# Patient Record
Sex: Male | Born: 1970 | Race: Black or African American | Hispanic: No | Marital: Single | Smoking: Never smoker
Health system: Southern US, Community
[De-identification: ages and names within clinical notes are randomized; demographics above are authoritative.]

## PROBLEM LIST (undated history)

## (undated) DIAGNOSIS — F4310 Post-traumatic stress disorder, unspecified: Secondary | ICD-10-CM

## (undated) DIAGNOSIS — I10 Essential (primary) hypertension: Secondary | ICD-10-CM

## (undated) HISTORY — PX: SHOULDER SURGERY: SHX246

## (undated) HISTORY — PX: KNEE SURGERY: SHX244

---

## 1999-02-26 ENCOUNTER — Emergency Department (HOSPITAL_COMMUNITY): Admission: EM | Admit: 1999-02-26 | Discharge: 1999-02-27 | Payer: Self-pay | Admitting: Emergency Medicine

## 2000-02-08 ENCOUNTER — Encounter: Payer: Self-pay | Admitting: Emergency Medicine

## 2000-02-08 ENCOUNTER — Inpatient Hospital Stay (HOSPITAL_COMMUNITY): Admission: EM | Admit: 2000-02-08 | Discharge: 2000-02-10 | Payer: Self-pay | Admitting: Emergency Medicine

## 2003-09-21 ENCOUNTER — Emergency Department (HOSPITAL_COMMUNITY): Admission: EM | Admit: 2003-09-21 | Discharge: 2003-09-21 | Payer: Self-pay | Admitting: Emergency Medicine

## 2004-09-13 ENCOUNTER — Ambulatory Visit: Payer: Self-pay | Admitting: Infectious Diseases

## 2004-09-13 ENCOUNTER — Inpatient Hospital Stay (HOSPITAL_COMMUNITY): Admission: EM | Admit: 2004-09-13 | Discharge: 2004-09-17 | Payer: Self-pay | Admitting: Emergency Medicine

## 2005-01-05 ENCOUNTER — Emergency Department (HOSPITAL_COMMUNITY): Admission: EM | Admit: 2005-01-05 | Discharge: 2005-01-05 | Payer: Self-pay | Admitting: Emergency Medicine

## 2005-04-16 ENCOUNTER — Emergency Department (HOSPITAL_COMMUNITY): Admission: EM | Admit: 2005-04-16 | Discharge: 2005-04-16 | Payer: Self-pay | Admitting: Emergency Medicine

## 2005-08-23 ENCOUNTER — Emergency Department (HOSPITAL_COMMUNITY): Admission: EM | Admit: 2005-08-23 | Discharge: 2005-08-23 | Payer: Self-pay | Admitting: Emergency Medicine

## 2007-10-24 ENCOUNTER — Emergency Department (HOSPITAL_COMMUNITY): Admission: EM | Admit: 2007-10-24 | Discharge: 2007-10-24 | Payer: Self-pay | Admitting: Emergency Medicine

## 2008-03-10 ENCOUNTER — Emergency Department (HOSPITAL_COMMUNITY): Admission: EM | Admit: 2008-03-10 | Discharge: 2008-03-10 | Payer: Self-pay | Admitting: Emergency Medicine

## 2010-10-17 ENCOUNTER — Emergency Department (HOSPITAL_COMMUNITY): Payer: Self-pay

## 2010-10-17 ENCOUNTER — Emergency Department (HOSPITAL_COMMUNITY)
Admission: EM | Admit: 2010-10-17 | Discharge: 2010-10-17 | Disposition: A | Discharge status: Home / Self Care | Payer: Self-pay | Attending: Emergency Medicine | Admitting: Emergency Medicine

## 2010-10-17 DIAGNOSIS — I10 Essential (primary) hypertension: Secondary | ICD-10-CM | POA: Insufficient documentation

## 2010-10-17 DIAGNOSIS — IMO0001 Reserved for inherently not codable concepts without codable children: Secondary | ICD-10-CM | POA: Insufficient documentation

## 2010-10-17 DIAGNOSIS — J069 Acute upper respiratory infection, unspecified: Secondary | ICD-10-CM | POA: Insufficient documentation

## 2010-10-17 LAB — DIFFERENTIAL
Basophils Absolute: 0 10*3/uL (ref 0.0–0.1)
Eosinophils Absolute: 0 10*3/uL (ref 0.0–0.7)
Lymphocytes Relative: 31 % (ref 12–46)
Lymphs Abs: 1.3 10*3/uL (ref 0.7–4.0)
Monocytes Relative: 9 % (ref 3–12)
Neutrophils Relative %: 59 % (ref 43–77)

## 2010-10-17 LAB — URINALYSIS, ROUTINE W REFLEX MICROSCOPIC
Protein, ur: 30 mg/dL — AB
Specific Gravity, Urine: 1.034 — ABNORMAL HIGH (ref 1.005–1.030)
Urobilinogen, UA: 1 mg/dL (ref 0.0–1.0)
pH: 6 (ref 5.0–8.0)

## 2010-10-17 LAB — CBC
HCT: 43 % (ref 39.0–52.0)
MCH: 28.1 pg (ref 26.0–34.0)
MCV: 82.9 fL (ref 78.0–100.0)
RBC: 5.19 MIL/uL (ref 4.22–5.81)

## 2010-10-17 LAB — BASIC METABOLIC PANEL
BUN: 9 mg/dL (ref 6–23)
CO2: 31 mEq/L (ref 19–32)
Calcium: 9.4 mg/dL (ref 8.4–10.5)
GFR calc non Af Amer: >60 mL/min (ref >60)

## 2010-10-17 LAB — URINE MICROSCOPIC-ADD ON

## 2010-10-19 LAB — URINE CULTURE: Culture  Setup Time: 201205120013
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
## 2010-10-24 NOTE — H&P (Signed)
[facility]  Patient:    Tony Henderson, Tony Henderson                     MRN: [id number] Adm. Date:  02/08/00 Attending:  Abran Cantor. Clovis Riley, MD                         History and Physical  CHIEF COMPLAINT:  Headache, nausea, vomiting and fever.  HISTORY OF PRESENT ILLNESS:  The patient is a 40 year old black male with a three-day history of progressively worsening headache as well as progressive stiffness of his neck.  He also started having nausea and vomiting with fever two days ago and has not been able to keep down liquids for the past day.  He is feeling somewhat thirsty.  He does have diffuse aching and weakness, but no focal complaints.  No other neurological complaints besides those listed above.  He does not have any known exposure or other possible cause for this. He has never had a problem like this before.  He has had some head congestion for the past week, but not a significant amount.  PAST MEDICAL HISTORY:  Unremarkable.  MEDICATIONS:  He takes some sort of antibiotic weekly for a rash as prescribed by a dermatologist, but he does not know the name of that medicine.  He has taken Benadryl and Tylenol with his current illness, but has not had much success with that.  ALLERGIES:  No known allergies.  SOCIAL HISTORY:  He does not smoke or drink alcohol.  He drinks two caffeine drinks per day.  He works in Clinical biochemist.  FAMILY HISTORY:  Unremarkable.  REVIEW OF SYSTEMS:  Unremarkable for _____ DD:  02/08/00 TD:  02/09/00 Job: [id number]

## 2010-10-24 NOTE — Discharge Summary (Signed)
Tony Henderson, Tony Henderson NO.:  [id number]   MEDICAL RECORD NO.:  [id number]          PATIENT TYPE:  INP   LOCATION:  0350                         FACILITY:  [facility]   PHYSICIAN:  Isidor Holts, M.D.  DATE OF BIRTH:  1971-05-07   DATE OF ADMISSION:  09/13/2004  DATE OF DISCHARGE:  09/17/2004                                 DISCHARGE SUMMARY   PMD:  Unassigned.   DISCHARGE DIAGNOSES:  1.  Aseptic meningitis.  2.  Diet-controlled hypertension.  3.  Early urinary tract infection, resolved.  4.  Renal insufficiency, resolved.   DISCHARGE MEDICATIONS:  Tylenol Extra Strength 1 gm p.o. p.r.n. q.6h. for  headache, over the counter.   PROCEDURE:  1.  Brain CT scan on September 13, 2004:  This was a negative, noncontrast head      CT.  2.  Fluoroscopic-guided lumbar puncture yielding 12 cc of cerebrospinal      fluid, September 13, 2004.   CONSULTATIONS:  Dr. Ninetta Lights, infectious disease.   ADMISSION HISTORY:  As in H&P notes of September 13, 2004; however, in brief,  this is a 40 year old male with a known history of aseptic meningitis in  September, 2001, who presented to the emergency department with a constant  headache described as generalized and not alleviated by Tylenol or NSAIDs.  He has also had generalized aches and pains, photophobia, some nuchal  rigidity, and vomited once. Denies clustering of cases, or sick contacts. He  was admitted for further evaluation, investigation, and management.   CLINICAL COURSE:  1.  Aseptic meningitis:  After initial evaluation, it was felt that the      patient's clinical features were consistent with possible meningitic      illness.  He therefore had a brain CT scan on September 13, 2004, which was      negative.  This was then followed with lumbar puncture under      fluoroscopic guidance, which yielded WBC 340, RBCs 150, segmented      neutrophils 2, percent lymphocytes 91%, monocytes 7%, glucose 44,      protein 102.  Gram's stain was  negative.  Patient was therefore      empirically commenced on a combination of acyclovir and Rocephin to      cover both viral meningitis and early bacterial meningitis.  Further      serologies were sent, including RPR, PCR for enterovirus, HIV test, West      Nile virus, cryptococcal antigen, Coccidioides immitis antigen, and also      fungal culture.  PPD was placed.  The patient was otherwise managed with      NSAIDs, simple analgesics, and intravenous fluids as well as      antiemetics.  Clinical response was satisfactory.  Nausea and      photophobia resolved, and nuchal pain resolved.  The patient remained      alert throughout the course of his hospital stay.  No episodes of      pyrexia were recorded.  On September 16, 2004, the patient was noted to have      a serum  creatinine of 1.8, which represented a significant increase from      admission creatinine of 1.2, i.e., indicative of mild renal      insufficiency.  Acyclovir was therefore discontinued.  Laboratory      studies obtained, including Enterococcus PCR, CSF cryptococcal antigen      was negative as was RPR and HIV test.  PPD was also negative.      Ceftriaxone was subsequently discontinued on September 16, 2004.   1.  Early urinary tract infection:  Urinalysis on September 14, 2004 showed WBCs      of 7-10 but was otherwise negative.  The patient had no symptoms      otherwise, such as dysuria. However, as he was already on ceftriaxone at      the time, no further changes were made to the patient's treatment.  As a      matter of fact, repeat urinalysis on September 17, 2004 was negative.   1.  Mild renal insufficiency:  As mentioned in #1 above, the patient was      found to have a creatinine of 1.8 on September 15, 2004 against his      admitting baseline of 1.2.  It was felt that acyclovir may have been      contributory.  This was discontinued.  Also, NSAIDs were held.  The      patient was rehydrated with intravenous normal saline.   On September 17, 2004, creatinine had normalized to 1.3.   1.  Diet-controlled hypertension:  The patient has a known history of      possible hypertension, although he is not on any medications for this.      He also seems quite reluctant to be started on antihypertensive      medication.  The blood pressure was closely monitored during the      patient's hospital stay.  He was managed with an 2gm sodium diet, and he      remained normotensive throughout.  It is expected that he will establish      an MD, and this will be followed up by his primary care physician.   DISPOSITION:  On September 17, 2004, the patient had improved so much so, that  he was deemed satisfactory to be discharged home.  He was discharged and  recommended off work until September 14, 2004.   PAIN MANAGEMENT:  Not applicable.   DIET:  2gm sodium diet.   WOUND CARE:  Not applicable.   SPECIAL INSTRUCTIONS:  Avoid NSAIDs.   FOLLOW-UP ARRANGEMENTS:  Patient is to follow up with Dr. Ninetta Lights,  infectious disease, in approximately one week.  The telephone number has  been supplied.  All of this has been communicated to the patient, and he has  verbalized understanding.      CO/MEDQ  D:  09/22/2004  T:  09/22/2004  Job:  [id number]   cc:   Lacretia Leigh. Ninetta Lights, M.D.  [address]  [place]  [place] [address]  Fax: [phone]

## 2013-12-12 ENCOUNTER — Emergency Department (HOSPITAL_COMMUNITY)
Admission: EM | Admit: 2013-12-12 | Discharge: 2013-12-12 | Disposition: A | Discharge status: Home / Self Care | Payer: No Typology Code available for payment source | Attending: Emergency Medicine | Admitting: Emergency Medicine

## 2013-12-12 ENCOUNTER — Encounter (HOSPITAL_COMMUNITY): Payer: Self-pay | Admitting: Emergency Medicine

## 2013-12-12 ENCOUNTER — Emergency Department (HOSPITAL_COMMUNITY): Payer: No Typology Code available for payment source

## 2013-12-12 DIAGNOSIS — S161XXA Strain of muscle, fascia and tendon at neck level, initial encounter: Secondary | ICD-10-CM

## 2013-12-12 DIAGNOSIS — Y92410 Unspecified street and highway as the place of occurrence of the external cause: Secondary | ICD-10-CM | POA: Insufficient documentation

## 2013-12-12 DIAGNOSIS — I10 Essential (primary) hypertension: Secondary | ICD-10-CM | POA: Insufficient documentation

## 2013-12-12 DIAGNOSIS — F4310 Post-traumatic stress disorder, unspecified: Secondary | ICD-10-CM | POA: Insufficient documentation

## 2013-12-12 DIAGNOSIS — IMO0002 Reserved for concepts with insufficient information to code with codable children: Secondary | ICD-10-CM | POA: Insufficient documentation

## 2013-12-12 DIAGNOSIS — Y9389 Activity, other specified: Secondary | ICD-10-CM | POA: Insufficient documentation

## 2013-12-12 DIAGNOSIS — Z79899 Other long term (current) drug therapy: Secondary | ICD-10-CM | POA: Insufficient documentation

## 2013-12-12 DIAGNOSIS — S139XXA Sprain of joints and ligaments of unspecified parts of neck, initial encounter: Secondary | ICD-10-CM | POA: Insufficient documentation

## 2013-12-12 HISTORY — DX: Post-traumatic stress disorder, unspecified: F43.10

## 2013-12-12 HISTORY — DX: Essential (primary) hypertension: I10

## 2013-12-12 MED ORDER — HYDROCODONE-ACETAMINOPHEN 5-325 MG PO TABS
2.0000 | ORAL_TABLET | Freq: Once | ORAL | Status: AC
  Administered 2013-12-12: 2 via ORAL
  Filled 2013-12-12: qty 2

## 2013-12-12 MED ORDER — DIAZEPAM 5 MG PO TABS
5.0000 mg | ORAL_TABLET | Freq: Once | ORAL | Status: AC
Start: 2013-12-12 — End: 2013-12-12
  Administered 2013-12-12: 5 mg via ORAL
  Filled 2013-12-12: qty 1

## 2013-12-12 MED ORDER — IBUPROFEN 800 MG PO TABS
800.0000 mg | ORAL_TABLET | Freq: Once | ORAL | Status: AC
  Administered 2013-12-12: 800 mg via ORAL
  Filled 2013-12-12: qty 1

## 2013-12-12 MED ORDER — HYDROCODONE-ACETAMINOPHEN 5-325 MG PO TABS: 1.0000 | ORAL_TABLET | Freq: Four times a day (QID) | ORAL | Status: DC | PRN

## 2013-12-12 MED ORDER — IBUPROFEN 800 MG PO TABS: 800.0000 mg | ORAL_TABLET | Freq: Three times a day (TID) | ORAL | Status: DC

## 2013-12-12 MED ORDER — CYCLOBENZAPRINE HCL 10 MG PO TABS: 10.0000 mg | ORAL_TABLET | Freq: Two times a day (BID) | ORAL | Status: DC | PRN
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
## 2013-12-12 NOTE — Discharge Instructions (Signed)
Take motrin for pain.   Take vicodin for severe pain. DO NOT drive with it.   Take flexeril for muscle spasms.   Follow up with your doctor. You may need further workup if you still have pain in weeks.   Return to ER if you have severe pain, vomiting, abdominal pain.
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
## 2013-12-31 ENCOUNTER — Emergency Department (HOSPITAL_COMMUNITY)
Admission: EM | Admit: 2013-12-31 | Discharge: 2013-12-31 | Disposition: A | Discharge status: Home / Self Care | Payer: No Typology Code available for payment source | Attending: Emergency Medicine | Admitting: Emergency Medicine

## 2013-12-31 ENCOUNTER — Encounter (HOSPITAL_COMMUNITY): Payer: Self-pay | Admitting: Emergency Medicine

## 2013-12-31 DIAGNOSIS — M5380 Other specified dorsopathies, site unspecified: Secondary | ICD-10-CM | POA: Insufficient documentation

## 2013-12-31 DIAGNOSIS — I10 Essential (primary) hypertension: Secondary | ICD-10-CM | POA: Insufficient documentation

## 2013-12-31 DIAGNOSIS — M62830 Muscle spasm of back: Secondary | ICD-10-CM

## 2013-12-31 DIAGNOSIS — Z791 Long term (current) use of non-steroidal anti-inflammatories (NSAID): Secondary | ICD-10-CM | POA: Insufficient documentation

## 2013-12-31 DIAGNOSIS — Z79899 Other long term (current) drug therapy: Secondary | ICD-10-CM | POA: Insufficient documentation

## 2013-12-31 DIAGNOSIS — M62838 Other muscle spasm: Secondary | ICD-10-CM

## 2013-12-31 DIAGNOSIS — F4310 Post-traumatic stress disorder, unspecified: Secondary | ICD-10-CM | POA: Insufficient documentation

## 2013-12-31 DIAGNOSIS — M549 Dorsalgia, unspecified: Secondary | ICD-10-CM | POA: Insufficient documentation

## 2013-12-31 DIAGNOSIS — M542 Cervicalgia: Secondary | ICD-10-CM | POA: Insufficient documentation

## 2013-12-31 MED ORDER — IBUPROFEN 200 MG PO TABS
600.0000 mg | ORAL_TABLET | Freq: Once | ORAL | Status: AC
  Administered 2013-12-31: 600 mg via ORAL
  Filled 2013-12-31: qty 3

## 2013-12-31 MED ORDER — METHOCARBAMOL 500 MG PO TABS: 500.0000 mg | ORAL_TABLET | Freq: Two times a day (BID) | ORAL | Status: DC

## 2013-12-31 MED ORDER — IBUPROFEN 600 MG PO TABS: 600.0000 mg | ORAL_TABLET | Freq: Four times a day (QID) | ORAL | Status: DC | PRN

## 2013-12-31 MED ORDER — TRAMADOL HCL 50 MG PO TABS: 50.0000 mg | ORAL_TABLET | Freq: Four times a day (QID) | ORAL | Status: DC | PRN

## 2013-12-31 NOTE — Discharge Instructions (Signed)
Our exams show that you have spasms causing the pain. Please take the meds prescribed. Ice and heat the area at least 3-4 times a day for 10 min.   Muscle Cramps and Spasms Muscle cramps and spasms occur when a muscle or muscles tighten and you have no control over this tightening (involuntary muscle contraction). They are a common problem and can develop in any muscle. The most common place is in the calf muscles of the leg. Both muscle cramps and muscle spasms are involuntary muscle contractions, but they also have differences:   Muscle cramps are sporadic and painful. They may last a few seconds to a quarter of an hour. Muscle cramps are often more forceful and last longer than muscle spasms.  Muscle spasms may or may not be painful. They may also last just a few seconds or much longer. CAUSES  It is uncommon for cramps or spasms to be due to a serious underlying problem. In many cases, the cause of cramps or spasms is unknown. Some common causes are:   Overexertion.   Overuse from repetitive motions (doing the same thing over and over).   Remaining in a certain position for a long period of time.   Improper preparation, form, or technique while performing a sport or activity.   Dehydration.   Injury.   Side effects of some medicines.   Abnormally low levels of the salts and ions in your blood (electrolytes), especially potassium and calcium. This could happen if you are taking water pills (diuretics) or you are pregnant.  Some underlying medical problems can make it more likely to develop cramps or spasms. These include, but are not limited to:   Diabetes.   Parkinson disease.   Hormone disorders, such as thyroid problems.   Alcohol abuse.   Diseases specific to muscles, joints, and bones.   Blood vessel disease where not enough blood is getting to the muscles.  HOME CARE INSTRUCTIONS   Stay well hydrated. Drink enough water and fluids to keep your urine  clear or pale yellow.  It may be helpful to massage, stretch, and relax the affected muscle.  For tight or tense muscles, use a warm towel, heating pad, or hot shower water directed to the affected area.  If you are sore or have pain after a cramp or spasm, applying ice to the affected area may relieve discomfort.  Put ice in a plastic bag.  Place a towel between your skin and the bag.  Leave the ice on for 15-20 minutes, 03-04 times a day.  Medicines used to treat a known cause of cramps or spasms may help reduce their frequency or severity. Only take over-the-counter or prescription medicines as directed by your caregiver. SEEK MEDICAL CARE IF:  Your cramps or spasms get more severe, more frequent, or do not improve over time.  MAKE SURE YOU:   Understand these instructions.  Will watch your condition.  Will get help right away if you are not doing well or get worse. Document Released: 11/14/2001 Document Revised: 09/19/2012 Document Reviewed: 05/11/2012 [facility] Patient Information 2015 [employer], [place]. This information is not intended to replace advice given to you by your health care provider. Make sure you discuss any questions you have with your health care provider.
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
## 2014-09-04 ENCOUNTER — Encounter (HOSPITAL_COMMUNITY): Payer: Self-pay | Admitting: Emergency Medicine

## 2014-09-04 ENCOUNTER — Emergency Department (HOSPITAL_COMMUNITY)
Admission: EM | Admit: 2014-09-04 | Discharge: 2014-09-04 | Disposition: A | Discharge status: Home / Self Care | Payer: Self-pay | Attending: Emergency Medicine | Admitting: Emergency Medicine

## 2014-09-04 DIAGNOSIS — F4310 Post-traumatic stress disorder, unspecified: Secondary | ICD-10-CM | POA: Insufficient documentation

## 2014-09-04 DIAGNOSIS — Z791 Long term (current) use of non-steroidal anti-inflammatories (NSAID): Secondary | ICD-10-CM | POA: Insufficient documentation

## 2014-09-04 DIAGNOSIS — I10 Essential (primary) hypertension: Secondary | ICD-10-CM | POA: Insufficient documentation

## 2014-09-04 DIAGNOSIS — J0100 Acute maxillary sinusitis, unspecified: Secondary | ICD-10-CM | POA: Insufficient documentation

## 2014-09-04 DIAGNOSIS — Z79899 Other long term (current) drug therapy: Secondary | ICD-10-CM | POA: Insufficient documentation

## 2014-09-04 MED ORDER — SULFAMETHOXAZOLE-TRIMETHOPRIM 800-160 MG PO TABS
1.0000 | ORAL_TABLET | Freq: Once | ORAL | Status: AC
  Administered 2014-09-04: 1 via ORAL
  Filled 2014-09-04: qty 1

## 2014-09-04 MED ORDER — SULFAMETHOXAZOLE-TRIMETHOPRIM 800-160 MG PO TABS: 1.0000 | ORAL_TABLET | Freq: Two times a day (BID) | ORAL | Status: DC

## 2014-09-04 MED ORDER — LORATADINE-PSEUDOEPHEDRINE ER 5-120 MG PO TB12: 1.0000 | ORAL_TABLET | Freq: Two times a day (BID) | ORAL | Status: DC

## 2014-09-04 MED ORDER — LORATADINE 10 MG PO TABS
10.0000 mg | ORAL_TABLET | Freq: Once | ORAL | Status: AC
  Administered 2014-09-04: 10 mg via ORAL
  Filled 2014-09-04: qty 1

## 2014-09-04 NOTE — Discharge Instructions (Signed)
Take bactrim as directed until gone. Take claritin daily for congestion. Refer to attached documents for more information.

## 2014-09-04 NOTE — ED Provider Notes (Signed)
CSN: [id number]     Arrival date & time 09/04/14  1427 History  This chart was scribed for non-physician practitioner, Emilia BeckKaitlyn Vannia Pola, PA-C working with Bethann BerkshireJoseph Zammit, MD by Luisa DagoPriscilla Tutu, Medical Scribe. This patient was seen in room TR09C/TR09C and the patient's care was started at 3:26 PM.    Chief Complaint  Patient presents with  . Sore Throat   Patient is a 44 y.o. male presenting with pharyngitis. The history is provided by the patient and medical records. No language interpreter was used.  Sore Throat The current episode started more than 1 week ago. The problem occurs constantly. The problem has not changed since onset.Pertinent negatives include no chest pain, no abdominal pain and no shortness of breath. Nothing aggravates the symptoms. Nothing relieves the symptoms. Treatments tried: unprescribed amoxicillin and oxycodone.   HPI Comments: Tony Henderson is a 44 y.o. male who presents to the Emergency Department complaining of gradual onset, gradually worsening sore throat that has been persistent for 1 week now. He is also complaining of difficulty swallowing secondary to the worsening pain. Pt states that he is also having difficulty swallowing secondary to the pain. He reports taking unprescribed amoxicillin and oxycodone without relief. Pt denies fever, neck pain, visual disturbance, CP, cough, SOB, abdominal pain, nausea, emesis, diarrhea, urinary symptoms, back pain, HA, weakness, numbness and rash as associated symptoms.     Past Medical History  Diagnosis Date  . Hypertension   . PTSD (post-traumatic stress disorder)     [employer]   History reviewed. No pertinent past surgical history. History reviewed. No pertinent family history. History  Substance Use Topics  . Smoking status: Never Smoker   . Smokeless tobacco: Not on file  . Alcohol Use: Yes     Comment: occ    Review of Systems  Constitutional: Negative for fever and chills.  HENT: Positive for sinus  pressure and sore throat. Negative for congestion, ear pain, rhinorrhea, trouble swallowing and voice change.   Eyes: Negative for discharge.  Respiratory: Negative for cough and shortness of breath.   Cardiovascular: Negative for chest pain.  Gastrointestinal: Negative for abdominal pain.  Genitourinary: Negative.   All other systems reviewed and are negative.     Allergies  Review of patient's allergies indicates no known allergies.  Home Medications   Prior to Admission medications   Medication Sig Start Date End Date Taking? Authorizing Provider  amLODipine-benazepril (LOTREL) 5-20 MG per capsule Take 1 capsule by mouth daily.    Historical Provider, MD  ARIPiprazole (ABILIFY) 20 MG tablet Take 20 mg by mouth daily.    Historical Provider, MD  cyclobenzaprine (FLEXERIL) 10 MG tablet Take 1 tablet (10 mg total) by mouth 2 (two) times daily as needed for muscle spasms. 12/12/13   Richardean Canalavid H Yao, MD  HYDROcodone-acetaminophen (NORCO/VICODIN) 5-325 MG per tablet Take 1 tablet by mouth every 6 (six) hours as needed for moderate pain or severe pain. 12/12/13   Richardean Canalavid H Yao, MD  ibuprofen (ADVIL,MOTRIN) 600 MG tablet Take 1 tablet (600 mg total) by mouth every 6 (six) hours as needed. 12/31/13   Derwood KaplanAnkit Nanavati, MD  ibuprofen (ADVIL,MOTRIN) 800 MG tablet Take 1 tablet (800 mg total) by mouth 3 (three) times daily. 12/12/13   Richardean Canalavid H Yao, MD  methocarbamol (ROBAXIN) 500 MG tablet Take 1 tablet (500 mg total) by mouth 2 (two) times daily. 12/31/13   Derwood KaplanAnkit Nanavati, MD  QUEtiapine (SEROQUEL) 200 MG tablet Take 200 mg by mouth every morning.  Historical Provider, MD  sertraline (ZOLOFT) 100 MG tablet Take 100 mg by mouth 2 (two) times daily.    Historical Provider, MD  traMADol (ULTRAM) 50 MG tablet Take 1 tablet (50 mg total) by mouth every 6 (six) hours as needed. 12/31/13   Ankit Rhunette Croft, MD   BP 146/89 mmHg  Pulse 71  Temp(Src) 97.9 F (36.6 C) (Oral)  Resp 16  Ht 6' (1.829 m)  Wt 220 lb (99.791  kg)  BMI 29.83 kg/m2  SpO2 98%  Physical Exam  Constitutional: He is oriented to person, place, and time. He appears well-developed and well-nourished. No distress.  HENT:  Head: Normocephalic and atraumatic.  Right Ear: External ear normal.  Left Ear: External ear normal.  Nose: Nose normal.  Mouth/Throat: Oropharynx is clear and moist. No oropharyngeal exudate.  Eyes: Conjunctivae and EOM are normal. Pupils are equal, round, and reactive to light.  Neck: Normal range of motion. Neck supple. No thyromegaly present.  Cardiovascular: Normal rate, regular rhythm and normal heart sounds.  Exam reveals no gallop and no friction rub.   No murmur heard. Pulmonary/Chest: Effort normal and breath sounds normal. No respiratory distress. He has no wheezes. He has no rales.  Abdominal: Soft. He exhibits no distension. There is no tenderness.  Musculoskeletal: Normal range of motion. He exhibits no edema or tenderness.  Lymphadenopathy:    He has no cervical adenopathy.  Neurological: He is alert and oriented to person, place, and time.  Skin: Skin is warm and dry.  Psychiatric: He has a normal mood and affect. His behavior is normal.  Nursing note and vitals reviewed.   ED Course  Procedures (including critical care time)  DIAGNOSTIC STUDIES: Oxygen Saturation is 98% on RA, normal by my interpretation.    COORDINATION OF CARE: 3:31 PM-Will prescribe different antibiotics and antihistamines. Pt advised of plan for treatment and pt agrees.   MDM   Final diagnoses:  Acute maxillary sinusitis, recurrence not specified    3:55 PM Patient will be treated for sinusitis with bactrim and claritin for symptoms. Vitals stable and patient afebrile.   I personally performed the services described in this documentation, which was scribed in my presence. The recorded information has been reviewed and is accurate.    [address] [place], PA-C 09/05/14 0845  Bethann Berkshire, MD 09/05/14 1041

## 2014-09-04 NOTE — ED Notes (Signed)
Pt c/o sore throat x several days; pt sts pain with swallowing

## 2014-11-14 ENCOUNTER — Encounter (HOSPITAL_COMMUNITY): Payer: Self-pay | Admitting: Emergency Medicine

## 2014-11-14 ENCOUNTER — Emergency Department (HOSPITAL_COMMUNITY)
Admission: EM | Admit: 2014-11-14 | Discharge: 2014-11-15 | Disposition: A | Discharge status: Home / Self Care | Payer: Self-pay | Attending: Emergency Medicine | Admitting: Emergency Medicine

## 2014-11-14 DIAGNOSIS — Z79899 Other long term (current) drug therapy: Secondary | ICD-10-CM | POA: Insufficient documentation

## 2014-11-14 DIAGNOSIS — F4310 Post-traumatic stress disorder, unspecified: Secondary | ICD-10-CM | POA: Insufficient documentation

## 2014-11-14 DIAGNOSIS — R519 Headache, unspecified: Secondary | ICD-10-CM

## 2014-11-14 DIAGNOSIS — R51 Headache: Secondary | ICD-10-CM | POA: Insufficient documentation

## 2014-11-14 DIAGNOSIS — R2241 Localized swelling, mass and lump, right lower limb: Secondary | ICD-10-CM | POA: Insufficient documentation

## 2014-11-14 DIAGNOSIS — I10 Essential (primary) hypertension: Secondary | ICD-10-CM | POA: Insufficient documentation

## 2014-11-14 DIAGNOSIS — R112 Nausea with vomiting, unspecified: Secondary | ICD-10-CM | POA: Insufficient documentation

## 2014-11-14 DIAGNOSIS — M7989 Other specified soft tissue disorders: Secondary | ICD-10-CM

## 2014-11-14 LAB — COMPREHENSIVE METABOLIC PANEL
ALT: 19 U/L (ref 17–63)
ANION GAP: 11 (ref 5–15)
AST: 20 U/L (ref 15–41)
Albumin: 4.3 g/dL (ref 3.5–5.0)
Alkaline Phosphatase: 68 U/L (ref 38–126)
BILIRUBIN TOTAL: 1.1 mg/dL (ref 0.3–1.2)
BUN: 15 mg/dL (ref 6–20)
CALCIUM: 9.6 mg/dL (ref 8.9–10.3)
CO2: 27 mmol/L (ref 22–32)
CREATININE: 1.27 mg/dL — AB (ref 0.61–1.24)
Chloride: 99 mmol/L — ABNORMAL LOW (ref 101–111)
GFR calc Af Amer: >60 mL/min (ref >60)
GLUCOSE: 119 mg/dL — AB (ref 65–99)
Potassium: 3.7 mmol/L (ref 3.5–5.1)
Sodium: 137 mmol/L (ref 135–145)
Total Protein: 7.6 g/dL (ref 6.5–8.1)

## 2014-11-14 LAB — CBC WITH DIFFERENTIAL/PLATELET
BASOS ABS: 0 10*3/uL (ref 0.0–0.1)
BASOS PCT: 0 % (ref 0–1)
Eosinophils Absolute: 0 10*3/uL (ref 0.0–0.7)
Eosinophils Relative: 1 % (ref 0–5)
HEMATOCRIT: 42.2 % (ref 39.0–52.0)
Hemoglobin: 14.3 g/dL (ref 13.0–17.0)
LYMPHS ABS: 1.3 10*3/uL (ref 0.7–4.0)
Lymphocytes Relative: 21 % (ref 12–46)
MCH: 27.9 pg (ref 26.0–34.0)
MCHC: 33.9 g/dL (ref 30.0–36.0)
MCV: 82.4 fL (ref 78.0–100.0)
MONO ABS: 0.3 10*3/uL (ref 0.1–1.0)
Monocytes Relative: 4 % (ref 3–12)
Neutro Abs: 4.4 10*3/uL (ref 1.7–7.7)
Neutrophils Relative %: 73 % (ref 43–77)
Platelets: 118 10*3/uL — ABNORMAL LOW (ref 150–400)
RBC: 5.12 MIL/uL (ref 4.22–5.81)
RDW: 13.3 % (ref 11.5–15.5)
WBC: 6 10*3/uL (ref 4.0–10.5)

## 2014-11-14 MED ORDER — METOCLOPRAMIDE HCL 5 MG/ML IJ SOLN
10.0000 mg | Freq: Once | INTRAMUSCULAR | Status: AC
  Administered 2014-11-14: 10 mg via INTRAVENOUS
  Filled 2014-11-14: qty 2

## 2014-11-14 MED ORDER — ONDANSETRON 4 MG PO TBDP
8.0000 mg | ORAL_TABLET | Freq: Once | ORAL | Status: AC
  Administered 2014-11-14: 8 mg via ORAL

## 2014-11-14 MED ORDER — ONDANSETRON 4 MG PO TBDP
ORAL_TABLET | ORAL | Status: AC
  Filled 2014-11-14: qty 2

## 2014-11-14 MED ORDER — DIPHENHYDRAMINE HCL 50 MG/ML IJ SOLN
25.0000 mg | Freq: Once | INTRAMUSCULAR | Status: AC
  Administered 2014-11-14: 25 mg via INTRAVENOUS
  Filled 2014-11-14: qty 1

## 2014-11-14 MED ORDER — KETOROLAC TROMETHAMINE 30 MG/ML IJ SOLN
30.0000 mg | Freq: Once | INTRAMUSCULAR | Status: AC
  Administered 2014-11-14: 30 mg via INTRAVENOUS
  Filled 2014-11-14: qty 1

## 2014-11-14 MED ORDER — SODIUM CHLORIDE 0.9 % IV BOLUS (SEPSIS)
1000.0000 mL | Freq: Once | INTRAVENOUS | Status: AC
  Administered 2014-11-14: 1000 mL via INTRAVENOUS

## 2014-11-14 NOTE — ED Notes (Addendum)
C/o headache since this morning and vomiting since this afternoon.  Pt [facility].  States he had been constipated s/p surgery but was able to have BM yesterday.  Taking Vicodin for pain.

## 2014-11-14 NOTE — ED Provider Notes (Signed)
CSN: [id number]     Arrival date & time 11/14/14  2009 History  This chart was scribed for Devoria Albe, MD by Annye Asa, ED Scribe. This patient was seen in room A10C/A10C and the patient's care was started at 11:09 PM.    Chief Complaint  Patient presents with  . Emesis  . Headache   The history is provided by the patient. No language interpreter was used.     HPI Comments: Tony Henderson is a 44 y.o. male who presents to the Emergency Department complaining of "thorbbing" frontal headache, with nausea and vomiting, beginning this afternoon around 14:30. Patient states his pain is exacerbated with movement, certain positions, and smells; it abates slightly when stationary. He explains he had a knee surgery done (right knee, torn meniscus and cartilage repair at the [place]) on 11/09/14 and was told to gradually begin bearing weight on the knee; he has attempted to do move about the house recently, after staying in bed for several days. He denies any other recent injuries, trauma, or strenuous activity. He notes some constipation since the surgery but was able to have a BM yesterday. He denies fever, photophobia, phonophobia, chest pain, SOB, dizziness, spinning, blurred or double vision, neck pain, numbness, in extremities, weakness, or paresthesias. He denies prior experience with similar headaches.   PCP at [facility].   Past Medical History  Diagnosis Date  . Hypertension   . PTSD (post-traumatic stress disorder)     [employer]   History reviewed. No pertinent past surgical history. No family history on file. History  Substance Use Topics  . Smoking status: Never Smoker   . Smokeless tobacco: Not on file  . Alcohol Use: Yes     Comment: occ  on disabilitly  Review of Systems  Constitutional: Negative for fever.  Eyes: Negative for photophobia and visual disturbance.  Respiratory: Negative for shortness of breath.   Cardiovascular: Negative for chest pain.  Gastrointestinal: Positive  for nausea and vomiting.  Musculoskeletal: Negative for neck pain.  Neurological: Positive for headaches. Negative for dizziness and numbness.  All other systems reviewed and are negative.   Allergies  Review of patient's allergies indicates no known allergies.  Home Medications   Prior to Admission medications   Medication Sig Start Date End Date Taking? Authorizing Provider  amLODipine-benazepril (LOTREL) 5-20 MG per capsule Take 1 capsule by mouth daily.   Yes Historical Provider, MD  ARIPiprazole (ABILIFY) 20 MG tablet Take 20 mg by mouth daily.   Yes Historical Provider, MD  QUEtiapine (SEROQUEL) 200 MG tablet Take 200 mg by mouth every morning.   Yes Historical Provider, MD  sertraline (ZOLOFT) 100 MG tablet Take 100 mg by mouth 2 (two) times daily.   Yes Historical Provider, MD   BP 173/118 mmHg  Pulse 91  Temp(Src) 98.5 F (36.9 C) (Oral)  Resp 20  SpO2 100%  Vital signs normal except hypertension  Physical Exam  Constitutional: He is oriented to person, place, and time. He appears well-developed and well-nourished.  Non-toxic appearance. He does not appear ill. No distress.  HENT:  Head: Normocephalic and atraumatic.  Right Ear: External ear normal.  Left Ear: External ear normal.  Nose: Nose normal. No mucosal edema or rhinorrhea.  Mouth/Throat: Oropharynx is clear and moist and mucous membranes are normal. No dental abscesses or uvula swelling.  Eyes: Conjunctivae and EOM are normal. Pupils are equal, round, and reactive to light.  Neck: Normal range of motion and full passive range of  motion without pain. Neck supple.  Cardiovascular: Normal rate, regular rhythm and normal heart sounds.  Exam reveals no gallop and no friction rub.   No murmur heard. Pulmonary/Chest: Effort normal and breath sounds normal. No respiratory distress. He has no wheezes. He has no rhonchi. He has no rales. He exhibits no tenderness and no crepitus.  Abdominal: Soft. Normal appearance and  bowel sounds are normal. He exhibits no distension. There is no tenderness. There is no rebound and no guarding.  Musculoskeletal: Normal range of motion. He exhibits tenderness. He exhibits no edema.  Two incisions with sutures in the right knee; some mild diffuse swelling in lower right leg and tenderness in his calf.   Neurological: He is alert and oriented to person, place, and time. He has normal strength. No cranial nerve deficit.  Skin: Skin is warm, dry and intact. No rash noted. No erythema. No pallor.  Psychiatric: He has a normal mood and affect. His speech is normal and behavior is normal. His mood appears not anxious.  Nursing note and vitals reviewed.   ED Course  Procedures   Medications  ondansetron (ZOFRAN-ODT) disintegrating tablet 8 mg (8 mg Oral Given 11/14/14 2026)  sodium chloride 0.9 % bolus 1,000 mL (0 mLs Intravenous Stopped 11/15/14 0047)  metoCLOPramide (REGLAN) injection 10 mg (10 mg Intravenous Given 11/14/14 2343)  diphenhydrAMINE (BENADRYL) injection 25 mg (25 mg Intravenous Given 11/14/14 2340)  ketorolac (TORADOL) 30 MG/ML injection 30 mg (30 mg Intravenous Given 11/14/14 2342)  magnesium sulfate IVPB 2 g 50 mL (0 g Intravenous Stopped 11/15/14 0416)  dexamethasone (DECADRON) injection 10 mg (10 mg Intravenous Given 11/15/14 0401)     DIAGNOSTIC STUDIES: Oxygen Saturation is 100% on RA, normal by my interpretation.    COORDINATION OF CARE: 11:15 PM Discussed treatment plan with pt at bedside and pt agreed to plan.  3:47 AM Returned to update and reevaluate patient, who reports that his headache has improved but not resolved entirely. He is requesting further medication for his headache at this time.   Pt keeps requesting food from nursing staff.  At 5 am pt states his HA is  "1" his main concern is getting food.     No results found.   Labs Review  Results for orders placed or performed during the hospital encounter of 11/14/14  CBC with Differential   Result Value Ref Range   WBC 6.0 4.0 - 10.5 K/uL   RBC 5.12 4.22 - 5.81 MIL/uL   Hemoglobin 14.3 13.0 - 17.0 g/dL   HCT 16.142.2 09.639.0 - 04.552.0 %   MCV 82.4 78.0 - 100.0 fL   MCH 27.9 26.0 - 34.0 pg   MCHC 33.9 30.0 - 36.0 g/dL   RDW 40.913.3 81.111.5 - 91.415.5 %   Platelets 118 (L) 150 - 400 K/uL   Neutrophils Relative % 73 43 - 77 %   Neutro Abs 4.4 1.7 - 7.7 K/uL   Lymphocytes Relative 21 12 - 46 %   Lymphs Abs 1.3 0.7 - 4.0 K/uL   Monocytes Relative 4 3 - 12 %   Monocytes Absolute 0.3 0.1 - 1.0 K/uL   Eosinophils Relative 1 0 - 5 %   Eosinophils Absolute 0.0 0.0 - 0.7 K/uL   Basophils Relative 0 0 - 1 %   Basophils Absolute 0.0 0.0 - 0.1 K/uL  Comprehensive metabolic panel  Result Value Ref Range   Sodium 137 135 - 145 mmol/L   Potassium 3.7 3.5 - 5.1 mmol/L  Chloride 99 (L) 101 - 111 mmol/L   CO2 27 22 - 32 mmol/L   Glucose, Bld 119 (H) 65 - 99 mg/dL   BUN 15 6 - 20 mg/dL   Creatinine, Ser 1.61 (H) 0.61 - 1.24 mg/dL   Calcium 9.6 8.9 - 09.6 mg/dL   Total Protein 7.6 6.5 - 8.1 g/dL   Albumin 4.3 3.5 - 5.0 g/dL   AST 20 15 - 41 U/L   ALT 19 17 - 63 U/L   Alkaline Phosphatase 68 38 - 126 U/L   Total Bilirubin 1.1 0.3 - 1.2 mg/dL   GFR calc non Af Amer >60 >60 mL/min   GFR calc Af Amer >60 >60 mL/min   Anion gap 11 5 - 15  Urinalysis, Routine w reflex microscopic (not at [facility])  Result Value Ref Range   Color, Urine YELLOW YELLOW   APPearance CLEAR CLEAR   Specific Gravity, Urine 1.026 1.005 - 1.030   pH 7.0 5.0 - 8.0   Glucose, UA NEGATIVE NEGATIVE mg/dL   Hgb urine dipstick SMALL (A) NEGATIVE   Bilirubin Urine NEGATIVE NEGATIVE   Ketones, ur 40 (A) NEGATIVE mg/dL   Protein, ur NEGATIVE NEGATIVE mg/dL   Urobilinogen, UA 1.0 0.0 - 1.0 mg/dL   Nitrite NEGATIVE NEGATIVE   Leukocytes, UA NEGATIVE NEGATIVE  Urine microscopic-add on  Result Value Ref Range   Squamous Epithelial / LPF RARE RARE   WBC, UA 0-2 <3 WBC/hpf   RBC / HPF 3-6 <3 RBC/hpf   Bacteria, UA RARE RARE    Laboratory interpretation all normal except renal insufficiency     Imaging Review No results found.   EKG Interpretation None      MDM   Final diagnoses:  Headache, unspecified headache type  Swelling of right lower extremity    Pt to get outpatient doppler US of his RLE today.  Plan discharge  Devoria Albe, MD, FACEP   I personally performed the services described in this documentation, which was scribed in my presence. The recorded information has been reviewed and considered.  Devoria Albe, MD, Concha Pyo, MD 11/15/14 [phone]

## 2014-11-15 LAB — URINE MICROSCOPIC-ADD ON

## 2014-11-15 LAB — URINALYSIS, ROUTINE W REFLEX MICROSCOPIC
BILIRUBIN URINE: NEGATIVE
Glucose, UA: NEGATIVE mg/dL
Ketones, ur: 40 mg/dL — AB
Leukocytes, UA: NEGATIVE
Nitrite: NEGATIVE
PH: 7 (ref 5.0–8.0)
Protein, ur: NEGATIVE mg/dL
SPECIFIC GRAVITY, URINE: 1.026 (ref 1.005–1.030)
UROBILINOGEN UA: 1 mg/dL (ref 0.0–1.0)

## 2014-11-15 MED ORDER — MAGNESIUM SULFATE 2 GM/50ML IV SOLN
2.0000 g | Freq: Once | INTRAVENOUS | Status: AC
  Administered 2014-11-15: 2 g via INTRAVENOUS
  Filled 2014-11-15: qty 50

## 2014-11-15 MED ORDER — DEXAMETHASONE SODIUM PHOSPHATE 4 MG/ML IJ SOLN
10.0000 mg | Freq: Once | INTRAMUSCULAR | Status: AC
  Administered 2014-11-15: 10 mg via INTRAVENOUS
  Filled 2014-11-15: qty 3

## 2014-11-15 NOTE — Discharge Instructions (Signed)
Go home and rest. You should get a call today about getting the test to check your right leg for a blood clot.  Return to the ED if you get chest pain or shortness of breath.

## 2014-11-18 ENCOUNTER — Emergency Department (HOSPITAL_COMMUNITY)
Admission: EM | Admit: 2014-11-18 | Discharge: 2014-11-18 | Disposition: A | Discharge status: Home / Self Care | Payer: Non-veteran care | Attending: Emergency Medicine | Admitting: Emergency Medicine

## 2014-11-18 ENCOUNTER — Emergency Department (HOSPITAL_BASED_OUTPATIENT_CLINIC_OR_DEPARTMENT_OTHER)
Admit: 2014-11-18 | Discharge: 2014-11-18 | Disposition: A | Discharge status: Home / Self Care | Payer: Non-veteran care | Attending: Emergency Medicine | Admitting: Emergency Medicine

## 2014-11-18 ENCOUNTER — Encounter (HOSPITAL_COMMUNITY): Payer: Self-pay | Admitting: Emergency Medicine

## 2014-11-18 DIAGNOSIS — I10 Essential (primary) hypertension: Secondary | ICD-10-CM | POA: Diagnosis not present

## 2014-11-18 DIAGNOSIS — R51 Headache: Secondary | ICD-10-CM | POA: Diagnosis present

## 2014-11-18 DIAGNOSIS — R630 Anorexia: Secondary | ICD-10-CM | POA: Insufficient documentation

## 2014-11-18 DIAGNOSIS — F4310 Post-traumatic stress disorder, unspecified: Secondary | ICD-10-CM | POA: Diagnosis not present

## 2014-11-18 DIAGNOSIS — M79661 Pain in right lower leg: Secondary | ICD-10-CM | POA: Insufficient documentation

## 2014-11-18 DIAGNOSIS — M7989 Other specified soft tissue disorders: Secondary | ICD-10-CM

## 2014-11-18 DIAGNOSIS — R519 Headache, unspecified: Secondary | ICD-10-CM

## 2014-11-18 DIAGNOSIS — Z9119 Patient's noncompliance with other medical treatment and regimen: Secondary | ICD-10-CM | POA: Insufficient documentation

## 2014-11-18 DIAGNOSIS — K029 Dental caries, unspecified: Secondary | ICD-10-CM

## 2014-11-18 DIAGNOSIS — M79609 Pain in unspecified limb: Secondary | ICD-10-CM

## 2014-11-18 DIAGNOSIS — R110 Nausea: Secondary | ICD-10-CM | POA: Insufficient documentation

## 2014-11-18 DIAGNOSIS — K088 Other specified disorders of teeth and supporting structures: Secondary | ICD-10-CM | POA: Insufficient documentation

## 2014-11-18 DIAGNOSIS — Z79899 Other long term (current) drug therapy: Secondary | ICD-10-CM | POA: Diagnosis not present

## 2014-11-18 DIAGNOSIS — Z9114 Patient's other noncompliance with medication regimen: Secondary | ICD-10-CM

## 2014-11-18 DIAGNOSIS — Z8679 Personal history of other diseases of the circulatory system: Secondary | ICD-10-CM

## 2014-11-18 MED ORDER — PROMETHAZINE HCL 25 MG PO TABS: 25.0000 mg | ORAL_TABLET | Freq: Four times a day (QID) | ORAL | Status: DC | PRN

## 2014-11-18 MED ORDER — DIPHENHYDRAMINE HCL 50 MG/ML IJ SOLN
25.0000 mg | Freq: Once | INTRAMUSCULAR | Status: AC
  Administered 2014-11-18: 25 mg via INTRAVENOUS
  Filled 2014-11-18: qty 1

## 2014-11-18 MED ORDER — METOCLOPRAMIDE HCL 5 MG/ML IJ SOLN
10.0000 mg | Freq: Once | INTRAMUSCULAR | Status: AC
  Administered 2014-11-18: 10 mg via INTRAVENOUS
  Filled 2014-11-18: qty 2

## 2014-11-18 MED ORDER — AMLODIPINE BESYLATE 10 MG PO TABS
10.0000 mg | ORAL_TABLET | Freq: Once | ORAL | Status: AC
  Administered 2014-11-18: 10 mg via ORAL
  Filled 2014-11-18: qty 1

## 2014-11-18 MED ORDER — ATENOLOL 50 MG PO TABS
50.0000 mg | ORAL_TABLET | Freq: Once | ORAL | Status: AC
  Administered 2014-11-18: 50 mg via ORAL
  Filled 2014-11-18: qty 1

## 2014-11-18 MED ORDER — KETOROLAC TROMETHAMINE 30 MG/ML IJ SOLN
30.0000 mg | Freq: Once | INTRAMUSCULAR | Status: AC
  Administered 2014-11-18: 30 mg via INTRAVENOUS
  Filled 2014-11-18: qty 1

## 2014-11-18 MED ORDER — DEXAMETHASONE SODIUM PHOSPHATE 10 MG/ML IJ SOLN
10.0000 mg | Freq: Once | INTRAMUSCULAR | Status: AC
  Administered 2014-11-18: 10 mg via INTRAVENOUS
  Filled 2014-11-18: qty 1

## 2014-11-18 MED ORDER — SODIUM CHLORIDE 0.9 % IV BOLUS (SEPSIS)
1000.0000 mL | Freq: Once | INTRAVENOUS | Status: AC
  Administered 2014-11-18: 1000 mL via INTRAVENOUS

## 2014-11-18 NOTE — ED Provider Notes (Signed)
Complains of diffuse headache gradual onset 8 days ago. Typical of headaches he gets 3 times per week for the past 20 years associated symptoms include nausea and photophobia. No fever no neck pain noted neck stiffness. He's been noncompliant with his blood pressure medication for the past  days On exam alert Glasgow Coma Score 15. No distress HEENT exam the facial asymmetry eyes pupils equal round react to light extraocular muscles intact. Fundi not well visualized No signs of meningitis neurologic Glasgow Coma Score 15 gait normal Romberg normal pronator drift normal finger to nose normal. Patient states his headache is currently under control. I stressed with him importance of compliance with medications. No signs of encephalopathy or meningitis  Doug Sou, MD 11/18/14 [phone]

## 2014-11-18 NOTE — ED Notes (Signed)
Pt c/o RLE edema, pt has had recent knee surgery at [facility], was due to have u/s on Monday but could not come back to have test. Pt also c/o HA, pt concerned d/t hx of menigitis.

## 2014-11-18 NOTE — Discharge Instructions (Signed)
°Emergency Department Resource Guide °1) Find a Doctor and Pay Out of Pocket °Although you won't have to find out who is covered by your insurance plan, it is a good idea to ask around and get recommendations. You will then need to call the office and see if the doctor you have chosen will accept you as a new patient and what types of options they offer for patients who are self-pay. Some doctors offer discounts or will set up payment plans for their patients who do not have insurance, but you will need to ask so you aren't surprised when you get to your appointment. ° °2) Contact Your Local Health Department °Not all health departments have doctors that can see patients for sick visits, but many do, so it is worth a call to see if yours does. If you don't know where your local health department is, you can check in your phone book. The CDC also has a tool to help you locate your state's health department, and many state websites also have listings of all of their local health departments. ° °3) Find a Walk-in Clinic °If your illness is not likely to be very severe or complicated, you may want to try a walk in clinic. These are popping up all over the country in pharmacies, drugstores, and shopping centers. They're usually staffed by nurse practitioners or physician assistants that have been trained to treat common illnesses and complaints. They're usually fairly quick and inexpensive. However, if you have serious medical issues or chronic medical problems, these are probably not your best option. ° °No Primary Care Doctor: °- Call [facility] at  832-8000 - they can help you locate a primary care doctor that  accepts your insurance, provides certain services, etc. °- Physician Referral Service- 1-800-533-3463 ° °Chronic Pain Problems: °Organization         Address  Phone   Notes  °[facility]  (336) 297-2271 Patients need to be referred by their primary care doctor.  ° °Medication  Assistance: °Organization         Address  Phone   Notes  °[place] [facility] [address] °[place], [place] [address] (336) 641-8030 --Must be a resident of [place] °-- Must have NO insurance coverage whatsoever (no Medicaid/ Medicare, etc.) °-- The pt. MUST have a primary care doctor that directs their care regularly and follows them in the community °  °[facility]  (866) 331-1348   °[employer]  (888) 892-1162   ° °Agencies that provide inexpensive medical care: °Organization         Address  Phone   Notes  °[facility]  (336) 832-8035   °[facility]    (336) 832-7272   °[facility] [address] °[place],  [address] (336) 832-4777   °[facility] [address], °[place] (336) 271-4999   °[facility]    (336) 373-0678   °[facility]    (336) 272-1050   °[facility] ° [address], [place] Phone:  (336) 832-4444, Fax:  (336) 832-4440 Hours of Operation:  9 am - 6 pm, M-F.  Also accepts Medicaid/Medicare and self-pay.  °[facility] ° [address], [place] Phone: (336) 832-3150, Fax: (336) 832-3151. Hours of Operation:  8:30 am - 5:30 pm, M-F.  Also accepts Medicaid and self-pay.  °[facility] [place] [address], [place] Phone: (336) 878-6027   °[facility] [address], [place], [place] (336)723-1848, Ext. 123 Mondays & Thursdays: 7-9 AM.  First 15 patients are seen on a first come, first serve basis. °  ° °Medicaid-accepting [place] Providers: ° °Organization         Address  Phone   Notes  °[facility] [address], [place] (336) 641-2100 Also accepts self-pay patients.  °[facility] [address], [place] ° (336) 856-9996   °[facility] [address], [place]  (336) 288-8857   °[facility] [address] [place] [address], [place] (336) 299-7000   °Veita Bland [address], [place]  ° (336) 373-1557 Only accepts [place] Access Medicaid patients after they have their name applied to their card.  ° °Self-Pay (no insurance) in [place]: ° °Organization         Address  Phone   Notes  °Sickle Cell Patients, [facility] [address], [place] (336) 832-1970   °[facility] [address], [place] (336) 832-4400   °[facility] ° [address], [place] (336) 992-4800   °[facility]/Dr. Osei-Bonsu ° [address] [place] [address], [place] or [address], [place] (336) 841-8500 Phone number for both [place] and [place] locations is the same.  °[facility] [address], [place] (336) 299-0000   °[facility] [address] [place] [address], [place] or [address] (336) 852-7530 °(336) 878-2260   °[facility] [address],  (336) 350-1642, phone; (336) 294-5005, fax Sees patients 1st and 3rd Saturday of every month.  Must not qualify for public or private insurance (i.e. Medicaid, Medicare, [place] Health Choice, Veterans' Benefits) • Household income should be no more than 200% of the poverty level •The clinic cannot treat you if you are pregnant or think you are pregnant • Sexually transmitted diseases are not treated at the clinic.  ° ° °Dental Care: °Organization         Address  Phone  Notes  °[place] [facility] [address],  (336) 641-6152 Accepts children up to age 21 who are enrolled in Medicaid or [place] Health Choice; pregnant women with a Medicaid card; and children who have applied for Medicaid or [place] Health Choice, but were declined, whose parents can pay a reduced fee at time of service.  °[place]  [facility] [place]  [address], [place] (336) 641-7733 Accepts children up to age 21 who are enrolled in Medicaid or [place] Health Choice; pregnant women with a Medicaid card; and children who have applied for Medicaid or [place] Health Choice, but were declined, whose parents can pay a reduced fee at time of service.  °[facility] ° [address],  (336) 641-4533 Patients are seen by appointment only. Walk-ins are not accepted. [facility] will see patients 18 years of age and older. °Monday - Tuesday (8am-5pm) °Most Wednesdays (8:30-5pm) °$30 per visit, cash only  °[facility] ° [address], [place] (336) 641-4533 Patients are seen by appointment only. Walk-ins are not accepted. [facility] will see patients 18 years of age and older. °One   Wednesday Evening (Monthly: Volunteer Based).  $30 per visit, cash only  °[facility]  (919) 537-3737 for adults; Children under age 4, call Graduate Pediatric Dentistry at ([phone]) [phone]. Children aged 4-14, please call (919) [phone] to request a pediatric application. ° Dental services are provided in all areas of dental care including fillings, crowns and bridges, complete and partial dentures, implants, gum treatment, root canals, and extractions. Preventive care is also provided. Treatment is provided to both adults and children. °Patients are selected via a lottery and there is often a waiting list. °  °[facility] [address], °[place] ° (336) 763-8833 [email or web address] °  °[facility] [address], [place], [place] (336)723-1848, Ext. 123 Second and Fourth Thursday of each month, opens at 6:30 AM; Clinic ends at 9 AM.  Patients are seen on a first-come first-served basis, and a limited number are seen during each clinic.  ° °[facility] ° [address], [place], [place] (336) 723-7904    Eligibility Requirements °You must have lived in [place], [place], or [place] counties for at least the last three months. °  You cannot be eligible for state or federal sponsored healthcare insurance, including Veterans Administration, Medicaid, or Medicare. °  You generally cannot be eligible for healthcare insurance through your employer.  °  How to apply: °Eligibility screenings are held every Tuesday and Wednesday afternoon from 1:00 pm until 4:00 pm. You do not need an appointment for the interview!  °[facility] [address], [place], [place] 336-631-2330   °[facility] [facility]  336-342-8273   °[facility]  336-703-3100   °[facility]  336-570-6415   ° °Behavioral Health Resources in the Community: °Intensive Outpatient Programs °Organization         Address  Phone  Notes  °[facility] [address], [place], [place] 336-878-6098   °[facility] [address], [place], [place] 336-832-9800   °[facility] [address], [place], [place] ° 336-882-2125   °[facility] [address],  °[place], [place] 1-800-853-5163 or 336-641-4981   °Substance Abuse Resources °Organization         Address  Phone  Notes  °[facility]  336-882-2125   °[facility]  336-784-9470   °[facility]  336-285-9073   °[facility]  336-845-3988   °Residential & Outpatient Substance Abuse Program  1-800-659-3381   °Psychological Services °Organization         Address  Phone  Notes  °[facility]  336- 832-9600   °[facility]  336- 378-7881   °[facility] [address], [place] 1-800-853-5163 or 336-641-4981   ° °Mobile Crisis Teams °Organization         Address  Phone  Notes  °[facility], Mobile Crisis Care Unit  1-877-626-1772   °Assertive °[facility] ° [address]  [place], [place] 336-834-9664   °Sharon DeEsch [address] ° [place] 336-554-5454   ° °Self-Help/Support Groups °Organization         Address  Phone             Notes  °[facility]  - variety of support groups  336- 373-1402 Call for more information  °Narcotics Anonymous (NA), [facility] [address], °[place]  2 meetings at this location  ° °  Residential Treatment Programs °Organization         Address  Phone  Notes  °[facility] [address],    °[place]  1-866-801-8205   °[facility] ° [address], [place], [place] 704-293-8524   °[facility] [address], [place] 336-845-3988 Admissions: 8am-3pm M-F  °[facility] [address]    °[place], [place] 336-841-1104   °[facility] [address], [place], [place] 336-379-7146   °[facility] [address]  °[place], [place] 336-285-9073   °[facility] Intensive Outpatient [address], [place], [place] 336-852-3033   °[facility] [address]  °[place], [place] 1-877-615-2722 or 336-784-9470   °[facility] [address] , [place] 336-227-7417 Accepts Medicaid  °[facility] [address]  ° [place] 1-800-659-3381 Substance Abuse/Addiction Treatment  ° °[place] Behavioral Health Resources °Organization         Address  Phone  Notes  °[facility]  (888) 581-9988   °Julie Brannon, PhD [address] [place], [place]   (336) 349-5553 or (336) 951-0000   °[facility]   [address] °[place], [place] (336) 349-4454   °[facility] [address], [place], [place] (336) 342-8316 Insurance/Medicaid/sponsorship through [facility]  °[facility] [address]                                    [place], [place] (336) 342-8316 Therapy/tele-psych/case    °[facility] [address]  ° [place], [place] (336) 349-2233    °Dr. Arfeen  (336) 349-4544   °[facility] [place]  [employer] [place] [facility] 1) [address], [place] °2) [address], [place] °3)  [address], [place] (336) 349-3220 °(336) 342-7768 ° °(336) 342-8140   °[place] [facility] (336) 342-1394 or (336) 342-3537 (After Hours)    ° ° °

## 2014-11-18 NOTE — ED Notes (Signed)
Spoke with Karren Burly Tech. Unable to provide and ETA.

## 2014-11-18 NOTE — Progress Notes (Signed)
Right lower extremity venous duplex completed.  Right:  No evidence of DVT, superficial thrombosis, or Baker's cyst.  Left:  Negative for DVT in the common femoral vein.  

## 2014-11-18 NOTE — ED Provider Notes (Signed)
CSN: [id number]     Arrival date & time 11/18/14  0553 History   First MD Initiated Contact with Patient 11/18/14 [phone]     Chief Complaint  Patient presents with  . Leg Swelling  . Headache     (Consider location/radiation/quality/duration/timing/severity/associated sxs/prior Treatment) HPI Pt is a 44yo male with hx of HTN and PTSD, presenting to ED with c/o gradually worsening diffuse headache, 10/10, worse with light and sound. States he thinks it is meningitis as he has had this in the past but denies fever, chills, neck pain or stiffness.  Pt states he has a bad tooth and thinks that could be what is causing his headaches as he was seen on 11/14/14 for same.  Pt states HA feels similar to previous.  Pt does report hx of HAs 3 times a week for 20 years.  He has not f/u with neurology.  Pt also c/o Right leg pain and swelling, reports having Right knee surgery at the [facility] on 11/09/14.  Pt was suppose to return to [facility] for an ultrasound but states he did not get around to going and would like one today.  Pt reports taking hydrocodone and cephalexin for his tooth w/o relief.    Past Medical History  Diagnosis Date  . Hypertension   . PTSD (post-traumatic stress disorder)     [employer]   Past Surgical History  Procedure Laterality Date  . Knee surgery    . Shoulder surgery     No family history on file. History  Substance Use Topics  . Smoking status: Never Smoker   . Smokeless tobacco: Not on file  . Alcohol Use: Yes     Comment: occ    Review of Systems  Constitutional: Positive for appetite change. Negative for fever, chills, diaphoresis and fatigue.  Eyes: Positive for photophobia. Negative for redness and visual disturbance.  Cardiovascular: Positive for leg swelling (Right). Negative for chest pain and palpitations.  Gastrointestinal: Positive for nausea. Negative for vomiting, abdominal pain and diarrhea.  Musculoskeletal: Positive for myalgias (Right leg). Negative for back pain,  neck pain and neck stiffness.  Neurological: Positive for headaches. Negative for dizziness, tremors, seizures, syncope, facial asymmetry, speech difficulty, weakness, light-headedness and numbness.  All other systems reviewed and are negative.     Allergies  Review of patient's allergies indicates no known allergies.  Home Medications   Prior to Admission medications   Medication Sig Start Date End Date Taking? Authorizing Provider  amLODipine (NORVASC) 10 MG tablet Take 10 mg by mouth daily.   Yes Historical Provider, MD  ARIPiprazole (ABILIFY) 20 MG tablet Take 20 mg by mouth daily.   Yes Historical Provider, MD  atenolol (TENORMIN) 50 MG tablet Take 50 mg by mouth daily.   Yes Historical Provider, MD  cephALEXin (KEFLEX) 500 MG capsule Take 500 mg by mouth every 6 (six) hours. For 2 days.   Yes Historical Provider, MD  HYDROcodone-acetaminophen (NORCO) 10-325 MG per tablet Take 1-2 tablets by mouth every 6 (six) hours as needed for moderate pain.   Yes Historical Provider, MD  LISINOPRIL PO Take 40 mg by mouth daily.    Yes Historical Provider, MD  QUEtiapine (SEROQUEL) 200 MG tablet Take 200 mg by mouth every morning.   Yes Historical Provider, MD  sertraline (ZOLOFT) 100 MG tablet Take 100 mg by mouth 2 (two) times daily.   Yes Historical Provider, MD  promethazine (PHENERGAN) 25 MG tablet Take 1 tablet (25 mg total) by mouth every  6 (six) hours as needed for nausea or vomiting. 11/18/14   Junius Finner, PA-C   BP 136/87 mmHg  Pulse 76  Temp(Src) 98 F (36.7 C) (Oral)  Resp 17  Ht 6' (1.829 m)  Wt 215 lb (97.523 kg)  BMI 29.15 kg/m2  SpO2 100% Physical Exam  Constitutional: He is oriented to person, place, and time. He appears well-developed and well-nourished.  Pt lying in darkened exam room, NAD  HENT:  Head: Normocephalic and atraumatic.  Right Ear: Hearing, tympanic membrane, external ear and ear canal normal.  Left Ear: Hearing, tympanic membrane, external ear and ear  canal normal.  Nose: Nose normal.  Mouth/Throat: Uvula is midline, oropharynx is clear and moist and mucous membranes are normal.  Eyes: Conjunctivae and EOM are normal. Pupils are equal, round, and reactive to light. No scleral icterus.  Neck: Normal range of motion. Neck supple.  No nuchal rigidity or meningeal signs.  Cardiovascular: Normal rate, regular rhythm and normal heart sounds.   Pulmonary/Chest: Effort normal and breath sounds normal. No respiratory distress. He has no wheezes. He has no rales. He exhibits no tenderness.  Abdominal: Soft. Bowel sounds are normal. He exhibits no distension and no mass. There is no tenderness. There is no rebound and no guarding.  Musculoskeletal: Normal range of motion. He exhibits edema and tenderness.  Right lower leg: mild edema with tenderness to calf, compartments are soft.  Neurological: He is alert and oriented to person, place, and time. He has normal strength. No cranial nerve deficit or sensory deficit. He displays a negative Romberg sign. Coordination normal. GCS eye subscore is 4. GCS verbal subscore is 5. GCS motor subscore is 6.  Skin: Skin is warm and dry.  Nursing note and vitals reviewed.   ED Course  Procedures (including critical care time) Labs Review Labs Reviewed - No data to display  Imaging Review No results found.   EKG Interpretation None      MDM   Final diagnoses:  Recurrent headache  History of hypertension  H/O medication noncompliance  Pain and swelling of right lower leg  Pain due to dental caries  Nausea    Pt is a 44yo male presenting to ED for HA and Right leg pain and swelling. Pt was seen 11/14/14 for same.  HA improved to 1/10 after zofran, IV fluids, benadryl, reglan, toradol, decadron, and magnesium. Pt states he was doing "okay" but had not had his BP medications in 2-3 days due to nausea from his headaches. Pt concerned for meningitis.  Exam, not c/w meningitis. No nuchal rigidity, pt is  non-toxic appearing. Afebrile.  No focal neuro deficit.  Pt does state HA is c/w prior headaches. States he has headaches 3 days a week for last 20 years. No recent head trauma.   Tx in ED: IV fluids, reglan, benadryl, toradol, and decadron.   BP elevated, will give pt his BP medications.  Pt also in ED for venous doppler for Right leg pain and swelling after Right knee surgery at the [place] earlier this month.  Venous U/S today was negative for DVT.   Discussed pt with Dr. Ethelda Chick who also examined pt.  Agrees pt may be discharged home after Tx for pain in ED.    Filed Vitals:   11/18/14 0908  BP: 136/87  Pulse: 76  Temp: 98 F (36.7 C)  Resp: 17   Recommend f/u with PCP and headache specialist.  Strongly encouraged pt to take his BP medications as  prescribed. Home care instructions for headaches provided. Return precautions provided. Pt verbalized understanding and agreement with tx plan.      Junius Finner, PA-C 11/18/14 1610  Doug Sou, MD 11/18/14 [phone]

## 2014-11-18 NOTE — ED Notes (Signed)
Vascular at bedside

## 2014-11-27 ENCOUNTER — Emergency Department (HOSPITAL_COMMUNITY)
Admission: EM | Admit: 2014-11-27 | Discharge: 2014-11-27 | Disposition: A | Discharge status: Home / Self Care | Payer: Non-veteran care | Attending: Emergency Medicine | Admitting: Emergency Medicine

## 2014-11-27 DIAGNOSIS — F4310 Post-traumatic stress disorder, unspecified: Secondary | ICD-10-CM | POA: Insufficient documentation

## 2014-11-27 DIAGNOSIS — K0889 Other specified disorders of teeth and supporting structures: Secondary | ICD-10-CM

## 2014-11-27 DIAGNOSIS — K088 Other specified disorders of teeth and supporting structures: Secondary | ICD-10-CM | POA: Insufficient documentation

## 2014-11-27 DIAGNOSIS — I10 Essential (primary) hypertension: Secondary | ICD-10-CM | POA: Insufficient documentation

## 2014-11-27 DIAGNOSIS — Z79899 Other long term (current) drug therapy: Secondary | ICD-10-CM | POA: Diagnosis not present

## 2014-11-27 MED ORDER — PENICILLIN V POTASSIUM 500 MG PO TABS: 500.0000 mg | ORAL_TABLET | Freq: Three times a day (TID) | ORAL | Status: AC

## 2014-11-27 NOTE — ED Notes (Signed)
Pt complains of pain in his left lower molar since June 3rd. Pt states he has a consultation to get his tooth pulled next week but states he cannot stand the pain any longer. Pt states he has been taking norco 10-325, prescribed for knee surgery, and oragel for pain, which he states is no longer helping.

## 2014-11-27 NOTE — ED Provider Notes (Signed)
CSN: [id number]     Arrival date & time 11/27/14  1305 History  This chart was scribed for non-physician practitioner, Jaynie Crumble, PA-C working with Raeford Razor, MD by Placido Sou, ED scribe. This patient was seen in room WTR8/WTR8 and the patient's care was started at 1:17 PM.    Chief Complaint  Patient presents with  . Dental Pain   The history is provided by the patient. No language interpreter was used.    HPI Comments: Tony Henderson is a 44 y.o. male who presents to the Emergency Department complaining of moderate, constant, left lower molar pain with onset 3 weeks ago. He notes taking norco 10-325 which was prescribed for a prior knee surgery, oral gel, BC powder and antibiotics with no relief of symptoms. Pt further notes taking 5 hydrocodones last night in addition to his regular medications. He notes speaking to a dentist that couldn't fit him until until next week and he was unsure if he could get an emergency extraction of the affected tooth in the emergency department. He denies any other associated symptoms.   Past Medical History  Diagnosis Date  . Hypertension   . PTSD (post-traumatic stress disorder)     [employer]   Past Surgical History  Procedure Laterality Date  . Knee surgery    . Shoulder surgery     No family history on file. History  Substance Use Topics  . Smoking status: Never Smoker   . Smokeless tobacco: Not on file  . Alcohol Use: Yes     Comment: occ    Review of Systems  Constitutional: Negative for fever and chills.  HENT: Positive for dental problem.       Allergies  Review of patient's allergies indicates no known allergies.  Home Medications   Prior to Admission medications   Medication Sig Start Date End Date Taking? Authorizing Provider  amLODipine (NORVASC) 10 MG tablet Take 10 mg by mouth daily.    Historical Provider, MD  ARIPiprazole (ABILIFY) 20 MG tablet Take 20 mg by mouth daily.    Historical Provider, MD   atenolol (TENORMIN) 50 MG tablet Take 50 mg by mouth daily.    Historical Provider, MD  cephALEXin (KEFLEX) 500 MG capsule Take 500 mg by mouth every 6 (six) hours. For 2 days.    Historical Provider, MD  HYDROcodone-acetaminophen (NORCO) 10-325 MG per tablet Take 1-2 tablets by mouth every 6 (six) hours as needed for moderate pain.    Historical Provider, MD  LISINOPRIL PO Take 40 mg by mouth daily.     Historical Provider, MD  promethazine (PHENERGAN) 25 MG tablet Take 1 tablet (25 mg total) by mouth every 6 (six) hours as needed for nausea or vomiting. 11/18/14   Junius Finner, PA-C  QUEtiapine (SEROQUEL) 200 MG tablet Take 200 mg by mouth every morning.    Historical Provider, MD  sertraline (ZOLOFT) 100 MG tablet Take 100 mg by mouth 2 (two) times daily.    Historical Provider, MD   BP 149/98 mmHg  Pulse 81  Temp(Src) 97.5 F (36.4 C) (Oral)  Resp 16  SpO2 100% Physical Exam  Constitutional: He is oriented to person, place, and time. He appears well-developed and well-nourished. No distress.  HENT:  Head: Normocephalic and atraumatic.  Mouth/Throat: Oropharynx is clear and moist.  Good dentition. There is an old filling in the right lower second molar, tooth is tender to palpation. Do not see any obvious cavities, no surrounding gum swelling or obvious infection.  No trismus or swelling under the tongue.  Eyes: Conjunctivae and EOM are normal. Pupils are equal, round, and reactive to light.  Neck: Normal range of motion. Neck supple. No tracheal deviation present.  Cardiovascular: Normal rate.   Pulmonary/Chest: Breath sounds normal. No respiratory distress.  Neurological: He is alert and oriented to person, place, and time.  Skin: Skin is warm and dry.  Psychiatric: He has a normal mood and affect. His behavior is normal.  Nursing note and vitals reviewed.   ED Course  Procedures  DIAGNOSTIC STUDIES: Oxygen Saturation is 100% on RA, normal by my interpretation.     COORDINATION OF CARE: 1:21 PM Discussed treatment plan with pt at bedside including a dental referral and pt agreed to plan.  Labs Review Labs Reviewed - No data to display  Imaging Review No results found.   EKG Interpretation None      MDM   Final diagnoses:  Pain, dental    Patient with a right lower second molar dental pain. There is an old filling of the tooth, otherwise no obvious exam findings. Patient already taking 10 mg hydrocodone for his pain as well as Orajel and BC powder. I do not see any obvious signs of infection, however start on penicillin. Patient is requesting referral to a dentist. Will give on-call dentist. Instructed to follow-up soon as able.  Filed Vitals:   11/27/14 1310  BP: 149/98  Pulse: 81  Temp: 97.5 F (36.4 C)  TempSrc: Oral  Resp: 16  SpO2: 100%   I personally performed the services described in this documentation, which was scribed in my presence. The recorded information has been reviewed and is accurate.     Jaynie Crumble, PA-C 11/27/14 1335  Raeford Razor, MD 11/28/14 0800

## 2014-11-27 NOTE — Discharge Instructions (Signed)
Continue your pain medications at home. Take antibiotic as prescribed until all gone. Call the dentist I have referred you to and follow up as soon as able.    Dental Pain A tooth ache may be caused by cavities (tooth decay). Cavities expose the nerve of the tooth to air and hot or cold temperatures. It may come from an infection or abscess (also called a boil or furuncle) around your tooth. It is also often caused by dental caries (tooth decay). This causes the pain you are having. DIAGNOSIS  Your caregiver can diagnose this problem by exam. TREATMENT   If caused by an infection, it may be treated with medications which kill germs (antibiotics) and pain medications as prescribed by your caregiver. Take medications as directed.  Only take over-the-counter or prescription medicines for pain, discomfort, or fever as directed by your caregiver.  Whether the tooth ache today is caused by infection or dental disease, you should see your dentist as soon as possible for further care. SEEK MEDICAL CARE IF: The exam and treatment you received today has been provided on an emergency basis only. This is not a substitute for complete medical or dental care. If your problem worsens or new problems (symptoms) appear, and you are unable to meet with your dentist, call or return to this location. SEEK IMMEDIATE MEDICAL CARE IF:   You have a fever.  You develop redness and swelling of your face, jaw, or neck.  You are unable to open your mouth.  You have severe pain uncontrolled by pain medicine. MAKE SURE YOU:   Understand these instructions.  Will watch your condition.  Will get help right away if you are not doing well or get worse. Document Released: 05/25/2005 Document Revised: 08/17/2011 Document Reviewed: 01/11/2008 [facility] Patient Information 2015 [place], [place]. This information is not intended to replace advice given to you by your health care provider. Make sure you discuss any questions  you have with your health care provider.

## 2015-01-07 ENCOUNTER — Emergency Department (HOSPITAL_COMMUNITY)
Admission: EM | Admit: 2015-01-07 | Discharge: 2015-01-07 | Disposition: A | Discharge status: Home / Self Care | Payer: No Typology Code available for payment source | Attending: Emergency Medicine | Admitting: Emergency Medicine

## 2015-01-07 ENCOUNTER — Encounter (HOSPITAL_COMMUNITY): Payer: Self-pay

## 2015-01-07 DIAGNOSIS — S4992XA Unspecified injury of left shoulder and upper arm, initial encounter: Secondary | ICD-10-CM | POA: Insufficient documentation

## 2015-01-07 DIAGNOSIS — S3991XA Unspecified injury of abdomen, initial encounter: Secondary | ICD-10-CM | POA: Insufficient documentation

## 2015-01-07 DIAGNOSIS — Y998 Other external cause status: Secondary | ICD-10-CM | POA: Insufficient documentation

## 2015-01-07 DIAGNOSIS — Z79899 Other long term (current) drug therapy: Secondary | ICD-10-CM | POA: Diagnosis not present

## 2015-01-07 DIAGNOSIS — S39012A Strain of muscle, fascia and tendon of lower back, initial encounter: Secondary | ICD-10-CM | POA: Insufficient documentation

## 2015-01-07 DIAGNOSIS — F4310 Post-traumatic stress disorder, unspecified: Secondary | ICD-10-CM | POA: Insufficient documentation

## 2015-01-07 DIAGNOSIS — I10 Essential (primary) hypertension: Secondary | ICD-10-CM | POA: Insufficient documentation

## 2015-01-07 DIAGNOSIS — S161XXA Strain of muscle, fascia and tendon at neck level, initial encounter: Secondary | ICD-10-CM | POA: Insufficient documentation

## 2015-01-07 DIAGNOSIS — Y9389 Activity, other specified: Secondary | ICD-10-CM | POA: Diagnosis not present

## 2015-01-07 DIAGNOSIS — Y92410 Unspecified street and highway as the place of occurrence of the external cause: Secondary | ICD-10-CM | POA: Insufficient documentation

## 2015-01-07 DIAGNOSIS — S199XXA Unspecified injury of neck, initial encounter: Secondary | ICD-10-CM | POA: Diagnosis present

## 2015-01-07 MED ORDER — IBUPROFEN 600 MG PO TABS: 600.0000 mg | ORAL_TABLET | Freq: Once | ORAL | Status: DC

## 2015-01-07 MED ORDER — CYCLOBENZAPRINE HCL 5 MG PO TABS: 5.0000 mg | ORAL_TABLET | Freq: Three times a day (TID) | ORAL | Status: DC | PRN

## 2015-01-07 MED ORDER — IBUPROFEN 200 MG PO TABS
600.0000 mg | ORAL_TABLET | Freq: Once | ORAL | Status: AC
  Administered 2015-01-07: 600 mg via ORAL
  Filled 2015-01-07: qty 3
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
## 2015-05-19 ENCOUNTER — Emergency Department (HOSPITAL_BASED_OUTPATIENT_CLINIC_OR_DEPARTMENT_OTHER)
Admission: EM | Admit: 2015-05-19 | Discharge: 2015-05-19 | Disposition: A | Discharge status: Home / Self Care | Payer: Non-veteran care | Attending: Emergency Medicine | Admitting: Emergency Medicine

## 2015-05-19 ENCOUNTER — Emergency Department (HOSPITAL_BASED_OUTPATIENT_CLINIC_OR_DEPARTMENT_OTHER): Payer: Non-veteran care

## 2015-05-19 ENCOUNTER — Encounter (HOSPITAL_BASED_OUTPATIENT_CLINIC_OR_DEPARTMENT_OTHER): Payer: Self-pay | Admitting: *Deleted

## 2015-05-19 DIAGNOSIS — Z79899 Other long term (current) drug therapy: Secondary | ICD-10-CM | POA: Diagnosis not present

## 2015-05-19 DIAGNOSIS — Z9119 Patient's noncompliance with other medical treatment and regimen: Secondary | ICD-10-CM | POA: Diagnosis not present

## 2015-05-19 DIAGNOSIS — B9789 Other viral agents as the cause of diseases classified elsewhere: Secondary | ICD-10-CM

## 2015-05-19 DIAGNOSIS — I10 Essential (primary) hypertension: Secondary | ICD-10-CM | POA: Insufficient documentation

## 2015-05-19 DIAGNOSIS — J069 Acute upper respiratory infection, unspecified: Secondary | ICD-10-CM | POA: Insufficient documentation

## 2015-05-19 DIAGNOSIS — M791 Myalgia, unspecified site: Secondary | ICD-10-CM

## 2015-05-19 DIAGNOSIS — F4310 Post-traumatic stress disorder, unspecified: Secondary | ICD-10-CM | POA: Diagnosis not present

## 2015-05-19 DIAGNOSIS — R05 Cough: Secondary | ICD-10-CM | POA: Diagnosis present

## 2015-05-19 MED ORDER — AEROCHAMBER PLUS FLO-VU MEDIUM MISC
1.0000 | Freq: Once | Status: AC
  Administered 2015-05-19: 1
  Filled 2015-05-19: qty 1

## 2015-05-19 MED ORDER — BENZONATATE 100 MG PO CAPS: 100.0000 mg | ORAL_CAPSULE | Freq: Three times a day (TID) | ORAL | Status: AC

## 2015-05-19 MED ORDER — FLUTICASONE PROPIONATE 50 MCG/ACT NA SUSP: 2.0000 | Freq: Every day | NASAL | Status: AC

## 2015-05-19 MED ORDER — ALBUTEROL SULFATE HFA 108 (90 BASE) MCG/ACT IN AERS
2.0000 | INHALATION_SPRAY | RESPIRATORY_TRACT | Status: DC | PRN
  Administered 2015-05-19: 2 via RESPIRATORY_TRACT
  Filled 2015-05-19: qty 6.7

## 2015-05-19 NOTE — ED Notes (Signed)
DC instructions reviewed with pt, enforced handwashing with pt as well, also importance of using medication to control cough, opportunity for questions provided

## 2015-05-19 NOTE — Discharge Instructions (Signed)
1. Medications: flonase, mucinex, tessalon, usual home medications °2. Treatment: rest, drink plenty of fluids, take tylenol or ibuprofen for fever control °3. Follow Up: Please followup with your primary doctor in 3 days for discussion of your diagnoses and further evaluation after today's visit; if you do not have a primary care doctor use the resource guide provided to find one; Return to the ER for high fevers, difficulty breathing or other concerning symptoms ° ° ° °Upper Respiratory Infection, Adult °Most upper respiratory infections (URIs) are a viral infection of the air passages leading to the lungs. A URI affects the nose, throat, and upper air passages. The most common type of URI is nasopharyngitis and is typically referred to as "the common cold." °URIs run their course and usually go away on their own. Most of the time, a URI does not require medical attention, but sometimes a bacterial infection in the upper airways can follow a viral infection. This is called a secondary infection. Sinus and middle ear infections are common types of secondary upper respiratory infections. °Bacterial pneumonia can also complicate a URI. A URI can worsen asthma and chronic obstructive pulmonary disease (COPD). Sometimes, these complications can require emergency medical care and may be life threatening.  °CAUSES °Almost all URIs are caused by viruses. A virus is a type of germ and can spread from one person to another.  °RISKS FACTORS °You may be at risk for a URI if:  °· You smoke.   °· You have chronic heart or lung disease. °· You have a weakened defense (immune) system.   °· You are very young or very old.   °· You have nasal allergies or asthma. °· You work in crowded or poorly ventilated areas. °· You work in health care facilities or schools. °SIGNS AND SYMPTOMS  °Symptoms typically develop 2-3 days after you come in contact with a cold virus. Most viral URIs last 7-10 days. However, viral URIs from the  influenza virus (flu virus) can last 14-18 days and are typically more severe. Symptoms may include:  °· Runny or stuffy (congested) nose.   °· Sneezing.   °· Cough.   °· Sore throat.   °· Headache.   °· Fatigue.   °· Fever.   °· Loss of appetite.   °· Pain in your forehead, behind your eyes, and over your cheekbones (sinus pain). °· Muscle aches.   °DIAGNOSIS  °Your health care provider may diagnose a URI by: °· Physical exam. °· Tests to check that your symptoms are not due to another condition such as: °¨ Strep throat. °¨ Sinusitis. °¨ Pneumonia. °¨ Asthma. °TREATMENT  °A URI goes away on its own with time. It cannot be cured with medicines, but medicines may be prescribed or recommended to relieve symptoms. Medicines may help: °· Reduce your fever. °· Reduce your cough. °· Relieve nasal congestion. °HOME CARE INSTRUCTIONS  °· Take medicines only as directed by your health care provider.   °· Gargle warm saltwater or take cough drops to comfort your throat as directed by your health care provider. °· Use a warm mist humidifier or inhale steam from a shower to increase air moisture. This may make it easier to breathe. °· Drink enough fluid to keep your urine clear or pale yellow.   °· Eat soups and other clear broths and maintain good nutrition.   °· Rest as needed.   °· Return to work when your temperature has returned to normal or as your health care provider advises. You may need to stay home longer to avoid infecting others. You can also   use a face mask and careful hand washing to prevent spread of the virus. °· Increase the usage of your inhaler if you have asthma.   °· Do not use any tobacco products, including cigarettes, chewing tobacco, or electronic cigarettes. If you need help quitting, ask your health care provider. °PREVENTION  °The best way to protect yourself from getting a cold is to practice good hygiene.  °· Avoid oral or hand contact with people with cold symptoms.   °· Wash your hands often if  contact occurs.   °There is no clear evidence that vitamin C, vitamin E, echinacea, or exercise reduces the chance of developing a cold. However, it is always recommended to get plenty of rest, exercise, and practice good nutrition.  °SEEK MEDICAL CARE IF:  °· You are getting worse rather than better.   °· Your symptoms are not controlled by medicine.   °· You have chills. °· You have worsening shortness of breath. °· You have brown or red mucus. °· You have yellow or brown nasal discharge. °· You have pain in your face, especially when you bend forward. °· You have a fever. °· You have swollen neck glands. °· You have pain while swallowing. °· You have white areas in the back of your throat. °SEEK IMMEDIATE MEDICAL CARE IF:  °· You have severe or persistent: °¨ Headache. °¨ Ear pain. °¨ Sinus pain. °¨ Chest pain. °· You have chronic lung disease and any of the following: °¨ Wheezing. °¨ Prolonged cough. °¨ Coughing up blood. °¨ A change in your usual mucus. °· You have a stiff neck. °· You have changes in your: °¨ Vision. °¨ Hearing. °¨ Thinking. °¨ Mood. °MAKE SURE YOU:  °· Understand these instructions. °· Will watch your condition. °· Will get help right away if you are not doing well or get worse. °  °This information is not intended to replace advice given to you by your health care provider. Make sure you discuss any questions you have with your health care provider. °  °Document Released: 11/18/2000 Document Revised: 10/09/2014 Document Reviewed: 08/30/2013 °Elsevier Interactive Patient Education ©2016 Elsevier Inc. ° ° ° °Emergency Department Resource Guide °1) Find a Doctor and Pay Out of Pocket °Although you won't have to find out who is covered by your insurance plan, it is a good idea to ask around and get recommendations. You will then need to call the office and see if the doctor you have chosen will accept you as a new patient and what types of options they offer for patients who are self-pay. Some  doctors offer discounts or will set up payment plans for their patients who do not have insurance, but you will need to ask so you aren't surprised when you get to your appointment. ° °2) Contact Your Local Health Department °Not all health departments have doctors that can see patients for sick visits, but many do, so it is worth a call to see if yours does. If you don't know where your local health department is, you can check in your phone book. The CDC also has a tool to help you locate your state's health department, and many state websites also have listings of all of their local health departments. ° °3) Find a Walk-in Clinic °If your illness is not likely to be very severe or complicated, you may want to try a walk in clinic. These are popping up all over the country in pharmacies, drugstores, and shopping centers. They're usually staffed by nurse practitioners or   physician assistants that have been trained to treat common illnesses and complaints. They're usually fairly quick and inexpensive. However, if you have serious medical issues or chronic medical problems, these are probably not your best option. ° °No Primary Care Doctor: °- Call [facility] Connect at  832-8000 - they can help you locate a primary care doctor that  accepts your insurance, provides certain services, etc. °- Physician Referral Service- 1-800-533-3463 ° °Chronic Pain Problems: °Organization         Address  Phone   Notes  °[facility]  ([phone]) [phone] Patients need to be referred by their primary care doctor.  ° °Medication Assistance: °Organization         Address  Phone   Notes  °[place] [facility] [address] °[place], [place] [address] (336) 641-8030 --Must be a resident of [place] °-- Must have NO insurance coverage whatsoever (no Medicaid/ Medicare, etc.) °-- The pt. MUST have a primary care doctor that directs their care regularly and follows them in the  community °  °[facility]  (866) 331-1348   °[employer]  (888) 892-1162   ° °Agencies that provide inexpensive medical care: °Organization         Address  Phone   Notes  °[facility]  (336) 832-8035   °[facility]    (336) 832-7272   °[facility] [address] °[place], [place] [address] (336) 832-4777   °[facility] [address], °[place] (336) 271-4999   °[facility]    (336) 373-0678   °[facility]    (336) 272-1050   °[facility] ° [address], [place] Phone:  (336) 832-4444, Fax:  (336) 832-4440 Hours of Operation:  9 am - 6 pm, M-F.  Also accepts Medicaid/Medicare and self-pay.  °[facility] ° [address], [place] Phone: (336) [phone], Fax: (336) [phone]. Hours of Operation:  8:30 am - 5:30 pm, M-F.  Also accepts Medicaid and self-pay.  °[facility] [place] [address], [place] Phone: (336) 878-6027   °[facility] [address], [place], [place] (336)723-1848, Ext. 123 Mondays & Thursdays: 7-9 AM.  First 15 patients are seen on a first come, first serve basis. °  ° °Medicaid-accepting [place] Providers: ° °Organization         Address  Phone   Notes  °[facility] [address], [place] ([phone]) [phone] Also accepts self-pay patients.  °[facility] [address], [place] ° (336) [phone]   °[facility] [address], [place] (336) 288-8857   °[facility] [address] [place] [address], [place] (336) 299-7000   °Veita Bland [address], [place]  ° (336) [phone] Only accepts [place] Access Medicaid patients after they have their name applied to their card.  ° °Self-Pay (no insurance) in [place]: ° °Organization         Address  Phone   Notes  °[facility], [facility] [address], [place] (336) 832-1970   °[facility] [address], [place] (336) 832-4400   °[facility] ° [address], [place] (336) 992-4800   °[facility]/Dr. Osei-Bonsu ° [address] [place]   [place] [address], [place] or [address], [place] (336) [phone] Phone number for both [place] and [place] locations is the same.  °[facility] [address], [place] (336) [phone]   °[facility] [address] [place] [address], [place] or [address] (336) 852-7530 °(336) 878-2260   °[facility] [address], [place] (336) 350-1642, phone; (336) 294-5005, fax Sees patients 1st and 3rd Saturday of every month.  Must not qualify for public or private insurance (i.e. Medicaid, Medicare, [place] Health Choice, Veterans' Benefits) • Household income should be no more than 200% of the poverty level •The clinic cannot treat you if you are pregnant or think you are pregnant • Sexually transmitted diseases are not treated at the clinic.  ° ° °Dental Care: °Organization         Address  Phone  Notes  °[place] [facility] [address], [place] (336) 641-6152 Accepts children up to age 21 who are enrolled in Medicaid or [place] Health Choice; pregnant women with a Medicaid card; and children who have applied for Medicaid or [place] Health Choice, but were declined, whose parents can pay a reduced fee at time of service.  °[place] [facility] [place]  [address], [place] (336) 641-7733 Accepts children up to age 21 who are enrolled in Medicaid or [place] Health Choice; pregnant women with a Medicaid card; and children who have applied for Medicaid or [place] Health Choice, but were declined, whose parents can pay a reduced fee at time of service.  °[facility] ° [address], [place] (336) 641-4533 Patients are seen by appointment only. Walk-ins are not accepted. [facility] will see patients 18 years of age and older. °Monday - Tuesday (8am-5pm) °Most Wednesdays (8:30-5pm) °$30 per visit, cash only  °[facility] ° [address], [place] (336) [phone] Patients are seen by appointment only. Walk-ins are not accepted. [facility] will see patients 18 years of age and older. °One Wednesday Evening (Monthly: Volunteer Based).  $30 per visit, cash only  °[facility]  (919) [phone] for adults; Children under age 4, call Graduate Pediatric Dentistry at ([phone]) [phone]. Children aged 4-14, please call ([phone]) [phone] to request a pediatric application. ° Dental services are provided in all areas of dental care including fillings, crowns and bridges, complete and partial dentures, implants, gum treatment, root canals, and extractions. Preventive care is also provided. Treatment is provided to both adults and children. °Patients are selected via a lottery and there is often a waiting list. °  °[facility] [address], °[place] ° (336) 763-8833 [email or web address] °  °[facility] [address], [place],  (336)723-1848, Ext. 123 Second and Fourth Thursday of each month, opens at 6:30 AM; Clinic ends at 9 AM.  Patients are seen on a first-come first-served basis, and a limited number are seen during each clinic.  ° °[facility] ° [address], [place],  (336) [phone]   Eligibility Requirements °You must have lived in [place], [place], or [place] counties for at least the last three months. °  You cannot be eligible for state or federal sponsored healthcare insurance, including Veterans Administration, Medicaid, or Medicare. °  You generally cannot be eligible for healthcare insurance through your employer.  °  How to   Eligibility screenings are held every Tuesday and  Wednesday afternoon from 1:00 pm until 4:00 pm. You do not need an appointment for the interview!  [facility] [address] [place], [place] [phone]   [facility] Department  [phone]   [facility] Health Department  [phone]   [facility]  (878)543-3119    Behavioral Health Resources in the Community: Intensive Outpatient Programs Organization         Address  Phone  Notes  [facility] [address] [place], [place] [phone]   [facility] [address] [place], [place] [phone]   [facility] [address] [place], [place]  [phone]   [facility] [address]  [place], [place] 5-188-416-6063 or (517)211-0768   Substance Abuse Resources Organization         Address  Phone  Notes  [facility]  8250875321   [facility]  225-075-6544   [facility]  (865)603-4030   Floydene Flock  (610)470-8042   Residential & Outpatient Substance Abuse Program  431-564-9201   Psychological Services Organization         Address  Phone  Notes  [facility] Health  336(701)419-1005   [facility]  985-002-1602   [facility] [address], [place] (431) 796-1076 or 703-642-1167    Mobile Crisis Teams Organization         Address  Phone  Notes  [facility], Mobile Crisis Care Unit  (249)748-6770   [facility]  [address] [place], [place] [phone]   Doristine Locks [address] [place] [phone]    Self-Help/Support Groups Organization         Address  Phone             Notes  [facility] [place] - variety of support groups  336- I7437963 Call for more information  Narcotics Anonymous (NA), [facility] [address], [employer] [place]  2 meetings at this location   Risk manager         Address  Phone  Notes  [facility] [address],    [place]  [phone]   [facility]  [address] [place] [address], [place], [place] [phone]   [facility] [address] [place], [place] [phone] Admissions: 8am-3pm M-F  [facility] [address]    [place], [place] [phone]   [facility] [address] [place], [place], [place] [phone]   [facility] [address]  [place], [place] [phone]   [facility] - Intensive Outpatient [address], [place], [place] [phone]   [facility] ([facility] [address]  [place], [place] [phone] or [phone]   [facility] ([facility] [address] [place], [place] [phone] Accepts Medicaid  [facility] [address],  [place] [phone] Substance Abuse/Addiction Treatment   [facility] Organization         Address  Phone  Notes  [facility]  (385)198-4710   Angie Fava, PhD [address] [place], [place]   [phone] or [phone]   [facility] Behavioral   [address] [place], [place] (580) [phone]   [facility] [address] [place], [place] (  336) [phone] Insurance/Medicaid/sponsorship through [facility]  [facility] [address]                                    [place], [place] [phone](336) [phone] Therapy/tele-psych/case  [facility] [address]   [place], [place] [phone](336) [phone]    Dr. Lolly MustacheArfeen  [phone](336) [phone]   [facility] [place]  [employer] [facility] 1) [address], [place] 2) [address], [place] 3)  [address], [place] [phone](336) [phone](336) [phone]  [phone](336) [phone]   [facility] Child Abuse Hotline (219)293-3720(336) 803-003-8256 or (931) [phone](336) [phone] (After Hours)

## 2015-05-19 NOTE — ED Notes (Signed)
Productive cough with green sputum x 2 weeks.

## 2015-05-19 NOTE — ED Provider Notes (Signed)
CSN: [id number]     Arrival date & time 05/19/15  1502 History   First MD Initiated Contact with Patient 05/19/15 1512     Chief Complaint  Patient presents with  . Cough     (Consider location/radiation/quality/duration/timing/severity/associated sxs/prior Treatment) The history is provided by the patient and medical records. No language interpreter was used.     Tony Henderson is a 44 y.o. male  with a hx of hypertension, noncompliant with medication presents to the Emergency Department complaining of gradual, persistent, progressively worsening URI symptoms onset approximately 2 weeks ago. Associated symptoms include rhinorrhea, cough productive of yellow sputum, subjective fevers, generalized and throbbing headache, sinus congestion, sore throat.  Patient denies any travel, sick contacts. He reports he has not received a flu shot.  Patient reports taking ibuprofen at home with only moderate relief.  Nothing seems to make the symptoms worse. He denies neck pain, neck stiffness, chest pain, shortness of breath, abdominal pain, nausea, vomiting, or diarrhea weakness, dizziness, syncope dysuria, hematuria.  Past Medical History  Diagnosis Date  . Hypertension   . PTSD (post-traumatic stress disorder)     [employer]   Past Surgical History  Procedure Laterality Date  . Knee surgery    . Shoulder surgery     History reviewed. No pertinent family history. Social History  Substance Use Topics  . Smoking status: Never Smoker   . Smokeless tobacco: None  . Alcohol Use: Yes     Comment: occ    Review of Systems  Constitutional: Positive for fatigue. Negative for fever, chills and appetite change.  HENT: Positive for congestion, postnasal drip, rhinorrhea, sinus pressure and sore throat. Negative for ear discharge, ear pain and mouth sores.   Eyes: Negative for visual disturbance.  Respiratory: Positive for cough. Negative for chest tightness, shortness of breath, wheezing and stridor.    Cardiovascular: Negative for chest pain, palpitations and leg swelling.  Gastrointestinal: Negative for nausea, vomiting, abdominal pain and diarrhea.  Genitourinary: Negative for dysuria, urgency, frequency and hematuria.  Musculoskeletal: Negative for myalgias, back pain, arthralgias and neck stiffness.  Skin: Negative for rash.  Neurological: Positive for headaches. Negative for syncope, light-headedness and numbness.  Hematological: Negative for adenopathy.  Psychiatric/Behavioral: The patient is not nervous/anxious.   All other systems reviewed and are negative.     Allergies  Review of patient's allergies indicates no known allergies.  Home Medications   Prior to Admission medications   Medication Sig Start Date End Date Taking? Authorizing Provider  amLODipine (NORVASC) 10 MG tablet Take 10 mg by mouth daily.    Historical Provider, MD  ARIPiprazole (ABILIFY) 20 MG tablet Take 20 mg by mouth daily.    Historical Provider, MD  atenolol (TENORMIN) 50 MG tablet Take 50 mg by mouth daily.    Historical Provider, MD  benzonatate (TESSALON) 100 MG capsule Take 1 capsule (100 mg total) by mouth every 8 (eight) hours. 05/19/15   Corbyn Wildey, PA-C  fluticasone (FLONASE) 50 MCG/ACT nasal spray Place 2 sprays into both nostrils daily. 05/19/15   Mathius Birkeland, PA-C  LISINOPRIL PO Take 40 mg by mouth daily.     Historical Provider, MD  QUEtiapine (SEROQUEL) 200 MG tablet Take 200 mg by mouth every morning.    Historical Provider, MD  sertraline (ZOLOFT) 100 MG tablet Take 100 mg by mouth 2 (two) times daily.    Historical Provider, MD   BP 159/100 mmHg  Pulse 68  Temp(Src) 98.5 F (36.9 C) (Oral)  Resp  18  Ht 6' (1.829 m)  Wt 99.791 kg  BMI 29.83 kg/m2  SpO2 96% Physical Exam  Constitutional: He is oriented to person, place, and time. He appears well-developed and well-nourished. No distress.  HENT:  Head: Normocephalic and atraumatic.  Right Ear: Tympanic  membrane, external ear and ear canal normal.  Left Ear: Tympanic membrane, external ear and ear canal normal.  Nose: Mucosal edema and rhinorrhea present. No epistaxis. Right sinus exhibits no maxillary sinus tenderness and no frontal sinus tenderness. Left sinus exhibits no maxillary sinus tenderness and no frontal sinus tenderness.  Mouth/Throat: Uvula is midline and mucous membranes are normal. Mucous membranes are not pale and not cyanotic. No oropharyngeal exudate, posterior oropharyngeal edema, posterior oropharyngeal erythema or tonsillar abscesses.  Eyes: Conjunctivae are normal. Pupils are equal, round, and reactive to light.  Neck: Normal range of motion and full passive range of motion without pain.  Cardiovascular: Normal rate, normal heart sounds and intact distal pulses.   Pulmonary/Chest: Effort normal and breath sounds normal. No stridor.  Clear and equal breath sounds without focal wheezes, rhonchi, rales  Abdominal: Soft. Bowel sounds are normal. There is no tenderness.  Musculoskeletal: Normal range of motion.  Lymphadenopathy:    He has no cervical adenopathy.  Neurological: He is alert and oriented to person, place, and time.  Skin: Skin is warm and dry. No rash noted. He is not diaphoretic.  Psychiatric: He has a normal mood and affect.  Nursing note and vitals reviewed.   ED Course  Procedures (including critical care time)  Imaging Review Dg Chest 2 View  05/19/2015  CLINICAL DATA:  Cough, congestion for 2 weeks EXAM: CHEST  2 VIEW COMPARISON:  10/17/2010 and 04/16/2005 FINDINGS: Cardiomediastinal silhouette is unremarkable. No acute infiltrate or pleural effusion. No pulmonary edema. Bony thorax is unremarkable. IMPRESSION: No active cardiopulmonary disease. Electronically Signed   By: Natasha Mead M.D.   On: 05/19/2015 15:41   I have personally reviewed and evaluated these images and lab results as part of my medical decision-making.    MDM   Final diagnoses:   Viral URI with cough  Myalgia   Tony Henderson presents with URI symptoms.  Pt CXR negative for acute infiltrate. Patients symptoms are consistent with URI, likely viral etiology. Discussed that antibiotics are not indicated for viral infections. Pt will be discharged with symptomatic treatment.  Verbalizes understanding and is agreeable with plan. Pt is hemodynamically stable & in NAD prior to dc.  BP 159/100 mmHg  Pulse 68  Temp(Src) 98.5 F (36.9 C) (Oral)  Resp 18  Ht 6' (1.829 m)  Wt 99.791 kg  BMI 29.83 kg/m2  SpO2 96%    Dierdre Forth, PA-C 05/19/15 1614  Linwood Dibbles, MD 05/19/15 [phone]

## 2016-07-11 IMAGING — CR DG CHEST 2V
2 series · 2 of 2 positions shown · non-contrast
Comparison: 10/17/2010 and 04/16/2005

CLINICAL DATA: Cough, congestion for 2 weeks

EXAM:
CHEST  2 VIEW

[w chest pa *]
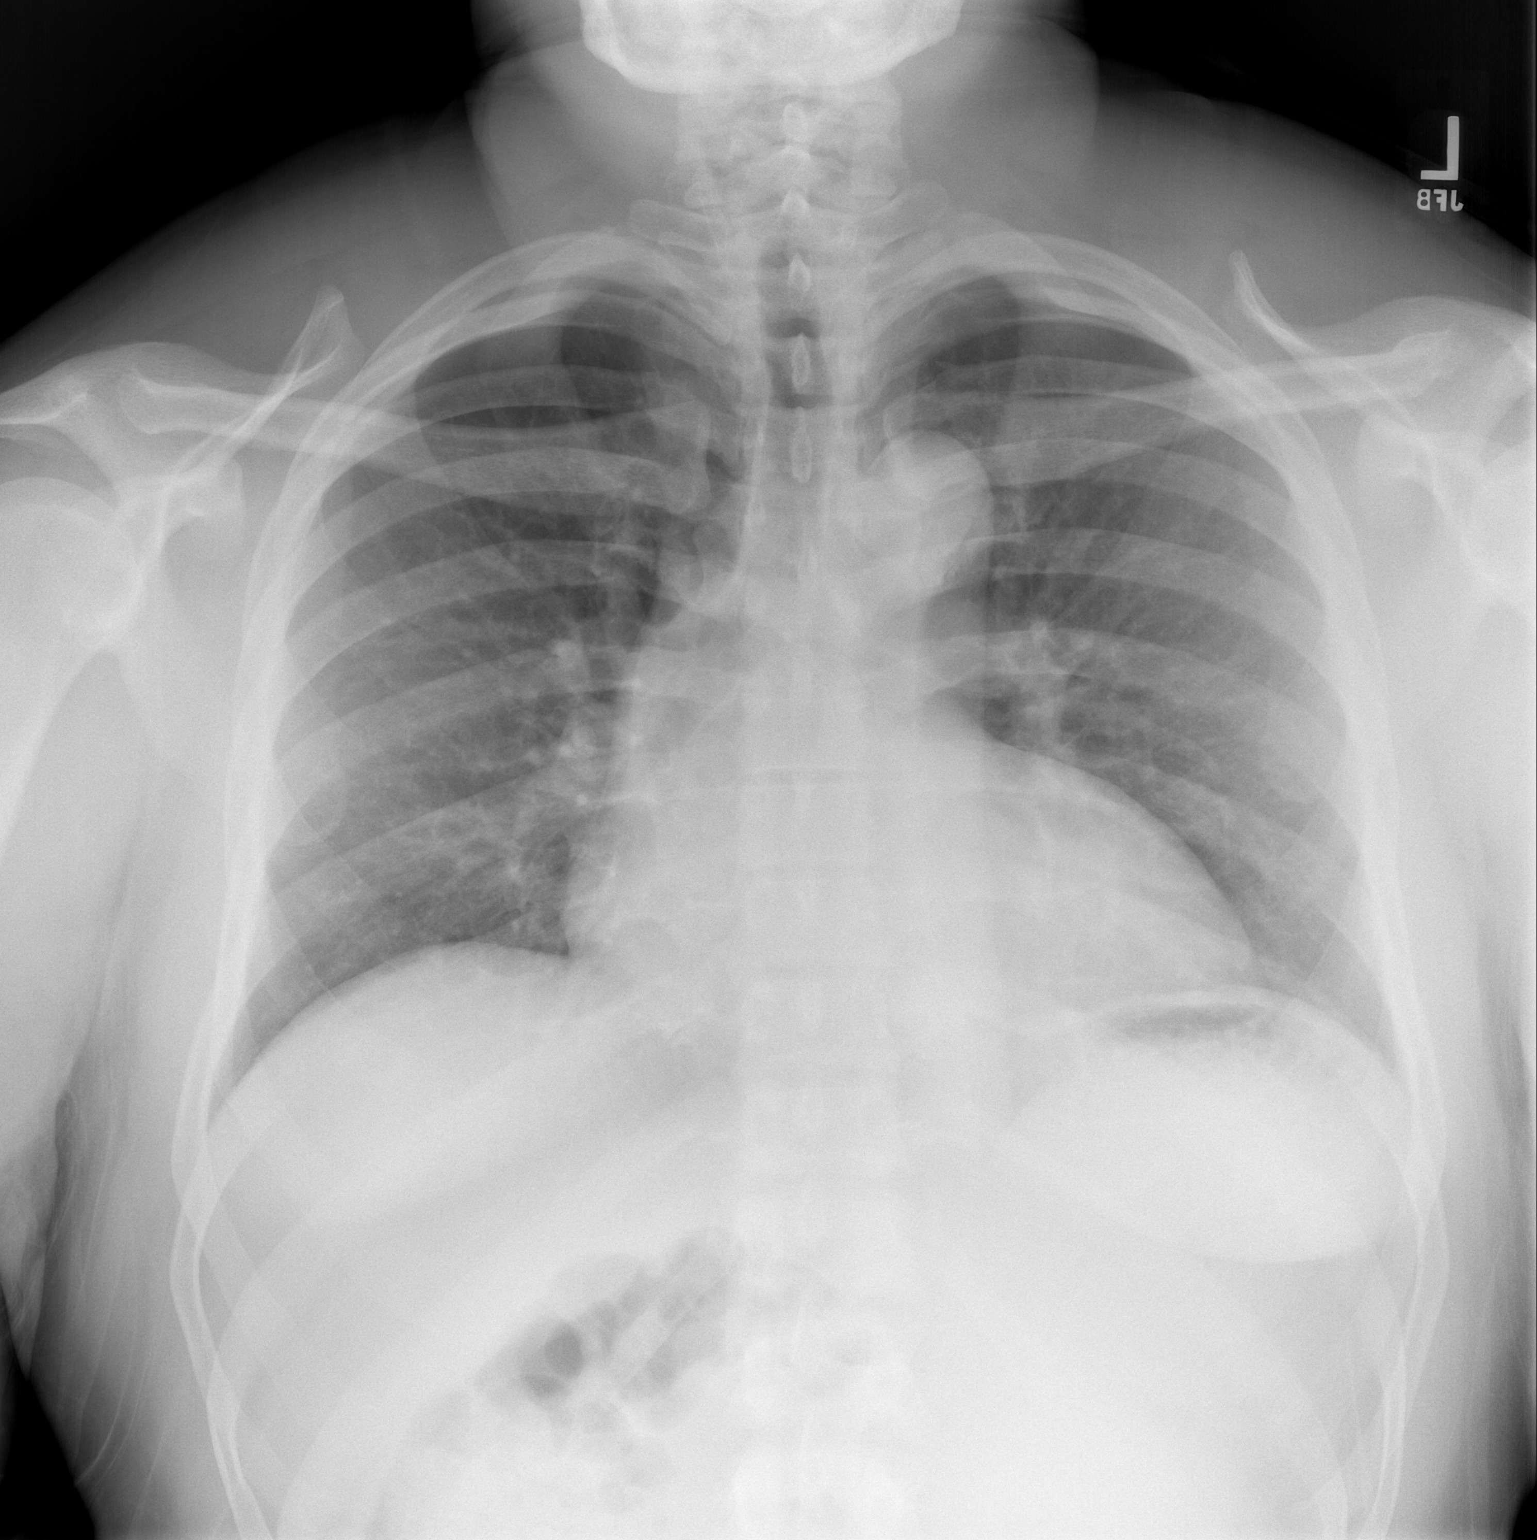

[w chest lat *]
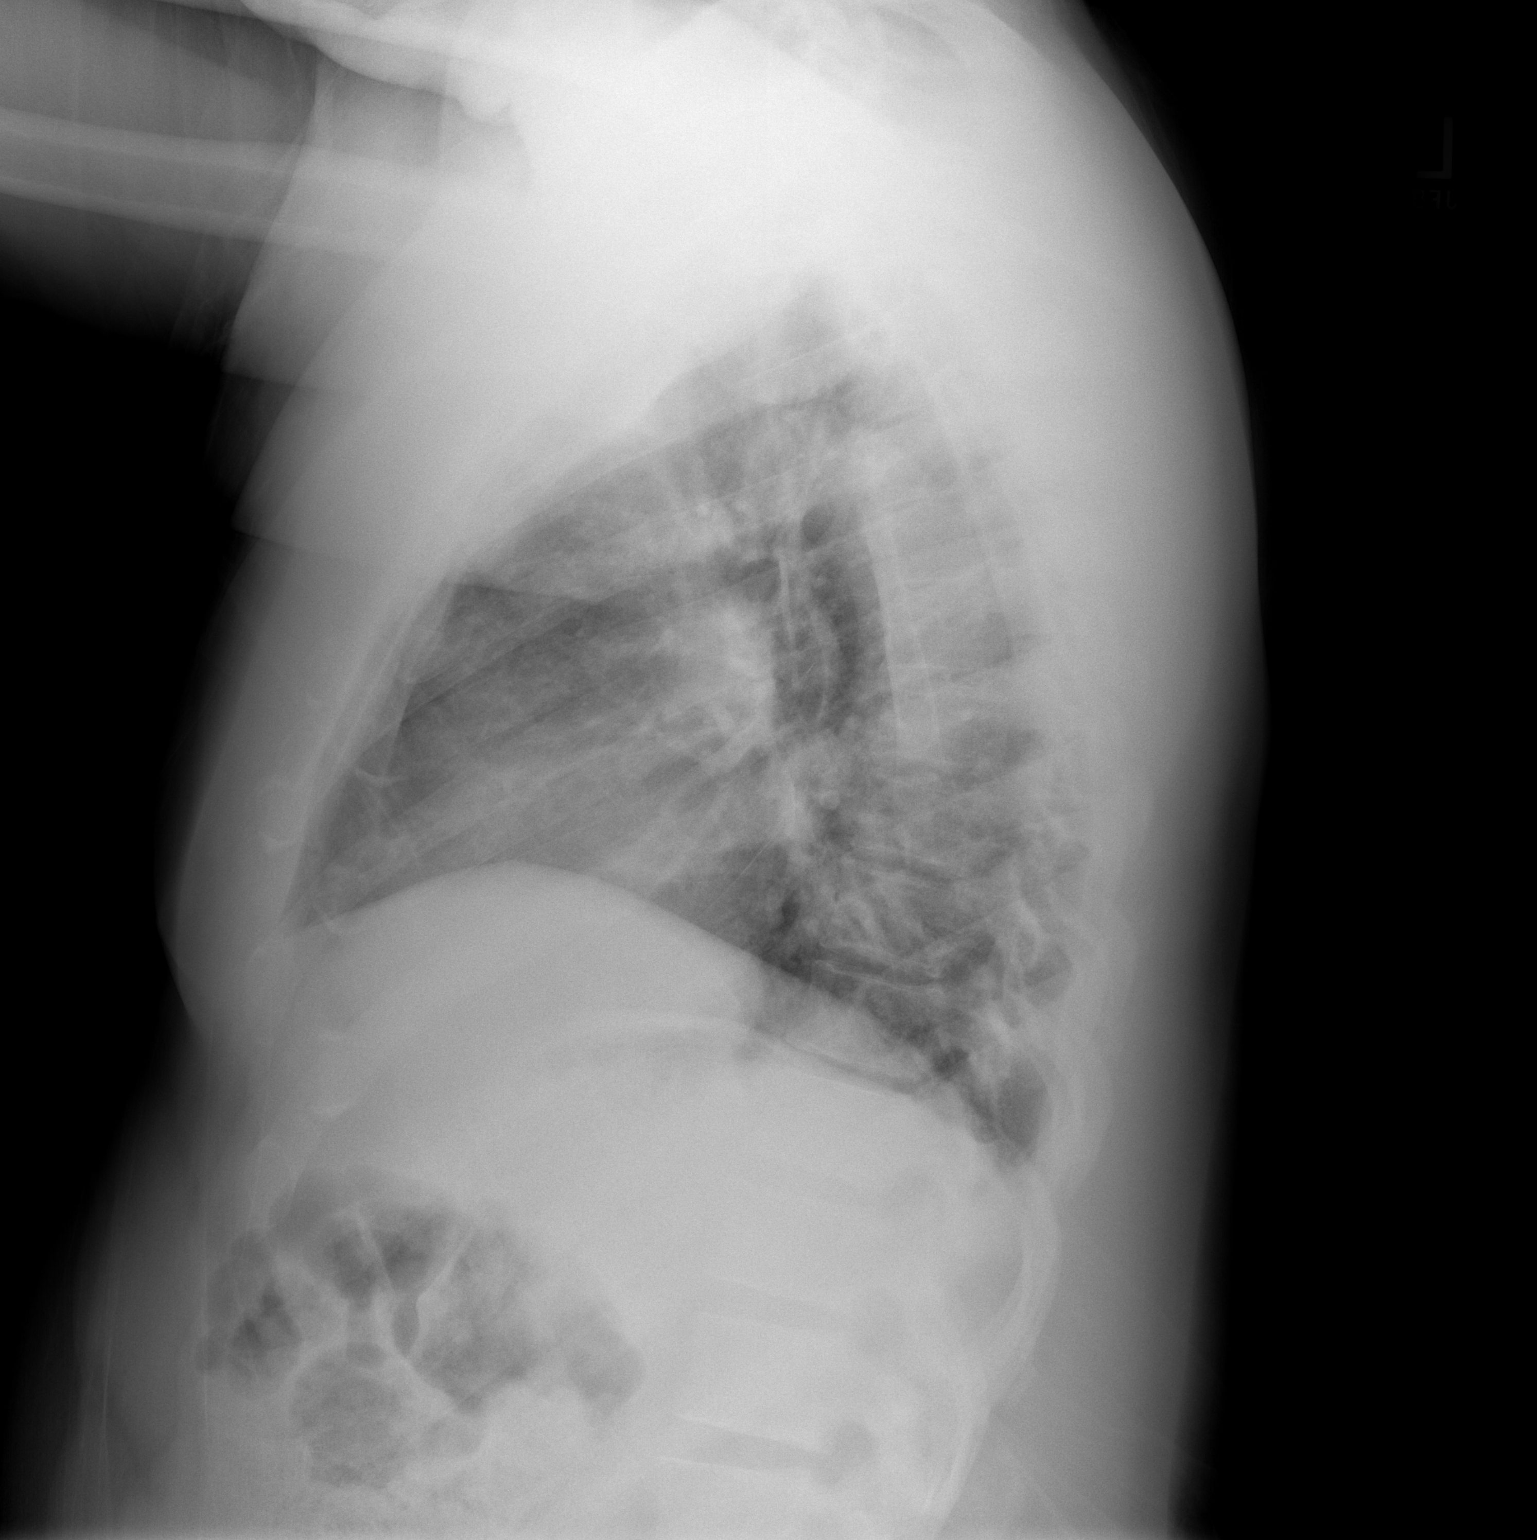

[2 of 2 positions shown; findings below may reference images not displayed]

FINDINGS: Cardiomediastinal silhouette is unremarkable. No acute infiltrate or
pleural effusion. No pulmonary edema. Bony thorax is unremarkable.
IMPRESSION: No active cardiopulmonary disease.
